# Patient Record
Sex: Female | Born: 1995 | Race: Black or African American | Hispanic: No | Marital: Single | Smoking: Never smoker
Health system: Southern US, Community
[De-identification: ages and names within clinical notes are randomized; demographics above are authoritative.]

## PROBLEM LIST (undated history)

## (undated) DIAGNOSIS — I456 Pre-excitation syndrome: Secondary | ICD-10-CM

## (undated) DIAGNOSIS — L309 Dermatitis, unspecified: Secondary | ICD-10-CM

## (undated) HISTORY — PX: TONSILLECTOMY AND ADENOIDECTOMY: SHX28

---
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
## 2016-07-20 ENCOUNTER — Emergency Department (HOSPITAL_COMMUNITY): Payer: Commercial Managed Care - HMO

## 2016-07-20 ENCOUNTER — Emergency Department (HOSPITAL_COMMUNITY)
Admission: EM | Admit: 2016-07-20 | Discharge: 2016-07-21 | Disposition: A | Discharge status: Home / Self Care | Payer: Commercial Managed Care - HMO | Attending: Emergency Medicine | Admitting: Emergency Medicine

## 2016-07-20 ENCOUNTER — Encounter (HOSPITAL_COMMUNITY): Payer: Self-pay

## 2016-07-20 DIAGNOSIS — J45909 Unspecified asthma, uncomplicated: Secondary | ICD-10-CM | POA: Insufficient documentation

## 2016-07-20 DIAGNOSIS — R002 Palpitations: Secondary | ICD-10-CM | POA: Insufficient documentation

## 2016-07-20 DIAGNOSIS — Z79899 Other long term (current) drug therapy: Secondary | ICD-10-CM | POA: Insufficient documentation

## 2016-07-20 DIAGNOSIS — Z91010 Allergy to peanuts: Secondary | ICD-10-CM | POA: Insufficient documentation

## 2016-07-20 LAB — BASIC METABOLIC PANEL
ANION GAP: 10 (ref 5–15)
BUN: 12 mg/dL (ref 6–20)
CALCIUM: 9.7 mg/dL (ref 8.9–10.3)
CO2: 23 mmol/L (ref 22–32)
Chloride: 105 mmol/L (ref 101–111)
Creatinine, Ser: 0.7 mg/dL (ref 0.44–1.00)
GLUCOSE: 85 mg/dL (ref 65–99)
POTASSIUM: 4.3 mmol/L (ref 3.5–5.1)
Sodium: 138 mmol/L (ref 135–145)

## 2016-07-20 LAB — CBC
HEMATOCRIT: 38.1 % (ref 36.0–46.0)
HEMOGLOBIN: 12.9 g/dL (ref 12.0–15.0)
MCH: 28.8 pg (ref 26.0–34.0)
MCHC: 33.9 g/dL (ref 30.0–36.0)
MCV: 85 fL (ref 78.0–100.0)
Platelets: 538 10*3/uL — ABNORMAL HIGH (ref 150–400)
RBC: 4.48 MIL/uL (ref 3.87–5.11)
RDW: 13.9 % (ref 11.5–15.5)
WBC: 7.2 10*3/uL (ref 4.0–10.5)

## 2016-07-20 LAB — I-STAT TROPONIN, ED: TROPONIN I, POC: 0.11 ng/mL — AB (ref 0.00–0.08)

## 2016-07-20 NOTE — ED Provider Notes (Signed)
[facility]-EMERGENCY DEPT Provider Note   CSN: [id number] Arrival date & time: 07/20/16  2138  By signing my name below, I, Majel Homer, attest that this documentation has been prepared under the direction and in the presence of Shon Baton, MD . Electronically Signed: Majel Homer, Scribe. 07/20/2016. 1:30 AM.  History   Chief Complaint Chief Complaint  Patient presents with  . Palpations  . Chest Pain   The history is provided by the patient. No language interpreter was used.   HPI Comments: Nicole Becker is a 21 y.o. female with PMHx of asthma, who presents to the Emergency Department complaining of sudden onset, sensation of palpitations that began  at ~2:00 PM this afternoon. Pt reports she was lying down at the onset of her symptoms and states it lasted for ~6 hours. She notes hx of palpitations that have spontaneously resolved on their own; however, she states her symptoms lasted for "longer than usual" which is why she visited the ED. Reports workup several years ago at a hospital in [place]. Pt reports associated chest pain secondary to her palpitations but notes her chest pain and palptiations have now resolved in the ED. She states her chest pain was exacerbated with exertion. She notes she has not taken any medication to relieve her symptoms. Pt denies fever, nausea, vomiting, shortness of breath, leg swelling, sick contacts, recent increase in caffeine intake, and hx of PE or echocardiogram.   Past Medical History:  Diagnosis Date  . Asthma   . Eczema    There are no active problems to display for this patient.  Past Surgical History:  Procedure Laterality Date  . TONSILLECTOMY AND ADENOIDECTOMY      OB History    No data available     Home Medications    Prior to Admission medications   Medication Sig Start Date End Date Taking? Authorizing Provider  albuterol (PROVENTIL HFA;VENTOLIN HFA) 108 (90 BASE) MCG/ACT inhaler Inhale 2 puffs into the lungs every 6  (six) hours as needed. For breathing   Yes Historical Provider, MD  cetirizine (ZYRTEC) 10 MG tablet Take 10 mg by mouth daily.   Yes Historical Provider, MD  Norgestimate-Ethinyl Estradiol Triphasic (TRI-LINYAH) 0.18/0.215/0.25 MG-35 MCG tablet Take 1 tablet by mouth daily.   Yes Historical Provider, MD  sodium fluoride (SF 5000 PLUS) 1.1 % CREA dental cream Place 1 application onto teeth every morning.    Yes Historical Provider, MD  tacrolimus (PROTOPIC) 0.1 % ointment Apply 1 application topically 2 (two) times daily as needed (rash).    Yes Historical Provider, MD    Family History No family history on file.  Social History Social History  Substance Use Topics  . Smoking status: Never Smoker  . Smokeless tobacco: Never Used  . Alcohol use No     Allergies   Montelukast sodium and Peanut-containing drug products   Review of Systems Review of Systems  Constitutional: Negative for fever.  Respiratory: Negative for shortness of breath.   Cardiovascular: Positive for chest pain (resolved) and palpitations (resolved). Negative for leg swelling.  Gastrointestinal: Negative for abdominal pain, diarrhea and vomiting.  All other systems reviewed and are negative.  Physical Exam Updated Vital Signs BP 105/75   Pulse 115   Temp 98 F (36.7 C)   Resp 18   Ht 5\' 4"  (1.626 m)   Wt 184 lb (83.5 kg)   LMP 07/07/2016   SpO2 100%   BMI 31.58 kg/m   Physical Exam  Constitutional: She  is oriented to person, place, and time. She appears well-developed and well-nourished. No distress.  HENT:  Head: Normocephalic and atraumatic.  Cardiovascular: Normal rate, regular rhythm and normal heart sounds.   No murmur heard. Pulmonary/Chest: Effort normal and breath sounds normal. No respiratory distress. She has no wheezes.  Abdominal: Soft. Bowel sounds are normal. There is no tenderness. There is no guarding.  Neurological: She is alert and oriented to person, place, and time.  Skin:  Skin is warm and dry.  Psychiatric: She has a normal mood and affect.  Nursing note and vitals reviewed.    ED Treatments / Results  Labs (all labs ordered are listed, but only abnormal results are displayed) Labs Reviewed  CBC - Abnormal; Notable for the following:       Result Value   Platelets 538 (*)    All other components within normal limits  D-DIMER, QUANTITATIVE (NOT AT [facility]) - Abnormal; Notable for the following:    D-Dimer, Quant 0.52 (*)    All other components within normal limits  TROPONIN I - Abnormal; Notable for the following:    Troponin I 0.19 (*)    All other components within normal limits  I-STAT TROPOININ, ED - Abnormal; Notable for the following:    Troponin i, poc 0.11 (*)    All other components within normal limits  BASIC METABOLIC PANEL    EKG  EKG Interpretation  Date/Time:  Sunday July 20 2016 21:46:25 EST Ventricular Rate:  109 PR Interval:  162 QRS Duration: 138 QT Interval:  342 QTC Calculation: 460 R Axis:   5 Text Interpretation:  Sinus tachycardia Non-specific intra-ventricular conduction block Nonspecific T wave abnormality Abnormal ECG No prior for comparison Confirmed by Wilkie Aye  MD, Thatcher Doberstein ([id number]) on 07/20/2016 11:07:26 PM       Radiology Dg Chest 2 View  Result Date: 07/20/2016 CLINICAL DATA:  Elevated heart rate.  Chest pain. EXAM: CHEST  2 VIEW COMPARISON:  None. FINDINGS: The heart size and mediastinal contours are within normal limits. Both lungs are clear. The visualized skeletal structures are unremarkable. IMPRESSION: No active cardiopulmonary disease. Electronically Signed   By: Gerome Sam III M.D   On: 07/20/2016 22:22   Ct Angio Chest Pe W And/or Wo Contrast  Result Date: 07/21/2016 CLINICAL DATA:  Chest pain under the left breast and left ribs radiating to the back since 3 a.m. Positive D-dimer. EXAM: CT ANGIOGRAPHY CHEST WITH CONTRAST TECHNIQUE: Multidetector CT imaging of the chest was performed using the  standard protocol during bolus administration of intravenous contrast. Multiplanar CT image reconstructions and MIPs were obtained to evaluate the vascular anatomy. CONTRAST:  100 mL Isovue 370 COMPARISON:  None. FINDINGS: Cardiovascular: Satisfactory opacification of the pulmonary arteries to the segmental level. No evidence of pulmonary embolism. Normal heart size. No pericardial effusion. Normal caliber thoracic aorta. No aortic dissection. Mediastinum/Nodes: No enlarged mediastinal, hilar, or axillary lymph nodes. Thyroid gland, trachea, and esophagus demonstrate no significant findings. Lungs/Pleura: Evaluation is limited due to motion artifact. No focal airspace disease or consolidation. No pleural effusions. No pneumothorax. Airways are patent. Upper Abdomen: No acute abnormality. Musculoskeletal: Prominent lymph nodes in the axillary regions bilaterally. Largest right axillary node measures 11 mm short axis dimension. These are nonspecific but likely to be reactive. No destructive bone lesions. Review of the MIP images confirms the above findings. IMPRESSION: No evidence of significant pulmonary embolus. No evidence of active pulmonary disease. Prominent axillary lymph nodes are likely reactive. Electronically Signed  By: Burman NievesWilliam  Stevens M.D.   On: 07/21/2016 05:10    Procedures Procedures (including critical care time)  Medications Ordered in ED Medications  iopamidol (ISOVUE-370) 76 % injection (not administered)  aspirin chewable tablet 324 mg (324 mg Oral Given 07/21/16 0326)    DIAGNOSTIC STUDIES:  Oxygen Saturation is 97% on RA, normal by my interpretation.    COORDINATION OF CARE:  1:13 AM Discussed treatment plan with pt at bedside and pt agreed to plan.  Initial Impression / Assessment and Plan / ED Course  I have reviewed the triage vital signs and the nursing notes.  Pertinent labs & imaging results that were available during my care of the patient were reviewed by me and  considered in my medical decision making (see chart for details).  Clinical Course as of Jul 21 548  Mon Jul 21, 2016  0300 Discussed with Dr. Tiburcio PeaHarris, fellow for cardiology. EKG abnormalities likely reflect accessory pathway. Patient will need close follow-up with EP. D-dimer is pending. Repeat troponin pending.  If increasing, patient may need admission.  [CH]  [id number] Discussed with Dr. Gala RomneyBensimhon.  He is not concerned with the elevation in troponin. Likely secondary to sustained tachycardia prior to arrival.  Very low concern for cardiac disease. She needs close EP follow-up. Per Dr. Gala RomneyBensimhon, she does not need formal cardiology evaluation or inpatient testing. She can follow-up with Dr. Ladona Ridgelaylor within the week.  [CH]    Clinical Course User Index [CH] Shon Batonourtney F Kemo Spruce, MD    Patient presents with palpitations. She's currently asymptomatic. Reports prolonged palpitations this afternoon. History of the same once previously and reports workup at an outside hospital. She is unsure of the results.  She is nontoxic. Vital signs reassuring. She is in sinus tachycardia.  EKG with a nonspecific interventricular conduction delay and abnormal morphology. No prior for comparison. See discussions with cardiology above. Initial discussion with fellow; states EKG represents likely a alternative pathway. Patient is a symptomatically and troponin stays stable, can likely be discharged home. Was concerned patient's troponin went from 0.11-0.19. She has remained asymptomatic. Discussed with Dr. Gala RomneyBensimhon.  He feels confident that this is likely related to sustained tachycardia prior to arrival. He will arrange for follow-up with Dr. Ladona Ridgelaylor. Patient is comfortable plan. Discussed plan with mother over the phone. She is also comfortable with plan. Patient was given strict return precautions.  After history, exam, and medical workup I feel the patient has been appropriately medically screened and is safe for discharge  home. Pertinent diagnoses were discussed with the patient. Patient was given return precautions.  I personally performed the services described in this documentation, which was scribed in my presence. The recorded information has been reviewed and is accurate.   Final Clinical Impressions(s) / ED Diagnoses   Final diagnoses:  Palpitations    New Prescriptions New Prescriptions   No medications on file     Shon Batonourtney F Caylan Schifano, MD 07/21/16 [phone]

## 2016-07-20 NOTE — ED Notes (Addendum)
I Stat Troponin results shown to Dr. Adriana Simasook.

## 2016-07-20 NOTE — ED Triage Notes (Signed)
Pt states that around 2 today she started having palpations and CP. Pt states that she has had palpations in the past that have resolved on their own and today it has not resolved. CP is L sided, heavy, denies SOB, denies n/v, some dizziness.

## 2016-07-21 ENCOUNTER — Emergency Department (HOSPITAL_COMMUNITY): Payer: Commercial Managed Care - HMO

## 2016-07-21 LAB — D-DIMER, QUANTITATIVE: D-Dimer, Quant: 0.52 ug{FEU}/mL — ABNORMAL HIGH (ref 0.00–0.50)

## 2016-07-21 LAB — TROPONIN I: Troponin I: 0.19 ng/mL

## 2016-07-21 MED ORDER — IOPAMIDOL (ISOVUE-370) INJECTION 76%
INTRAVENOUS | Status: AC
  Filled 2016-07-21: qty 100

## 2016-07-21 MED ORDER — ASPIRIN 81 MG PO CHEW
324.0000 mg | CHEWABLE_TABLET | Freq: Once | ORAL | Status: AC
  Administered 2016-07-21: 324 mg via ORAL
  Filled 2016-07-21: qty 4

## 2016-07-21 NOTE — ED Notes (Signed)
ED Provider at bedside. 

## 2016-07-21 NOTE — ED Notes (Signed)
Patient transported to CT 

## 2016-07-21 NOTE — Discharge Instructions (Signed)
You were seen today for palpitations. You likely have an accessory pathway in your heart causing you to go into an abnormal fast heart rate and rhythm. This had resolved upon her arrival. You need to follow-up closely with an electrophysiologist. See above. If you have recurrence of symptoms, you need to be reevaluated immediately.

## 2016-07-21 NOTE — ED Notes (Signed)
Spoke with lab personnel to confirm arrival of lab tubes. D-dimer and troponin currently in process.

## 2016-07-21 NOTE — ED Notes (Signed)
Unsuccessful blood draw,  QNS for D-dimer notified nurse.

## 2016-07-21 NOTE — ED Notes (Signed)
Water provided to patient per request.

## 2016-07-31 ENCOUNTER — Institutional Professional Consult (permissible substitution): Payer: BC Managed Care – PPO | Admitting: Internal Medicine

## 2016-08-12 DIAGNOSIS — R002 Palpitations: Secondary | ICD-10-CM | POA: Insufficient documentation

## 2016-08-13 ENCOUNTER — Encounter: Payer: Self-pay | Admitting: Internal Medicine

## 2016-08-13 ENCOUNTER — Encounter (INDEPENDENT_AMBULATORY_CARE_PROVIDER_SITE_OTHER): Payer: Self-pay

## 2016-08-13 ENCOUNTER — Ambulatory Visit (INDEPENDENT_AMBULATORY_CARE_PROVIDER_SITE_OTHER): Payer: Commercial Managed Care - HMO | Admitting: Internal Medicine

## 2016-08-13 VITALS — BP 118/74 | HR 102 | Ht 64.0 in | Wt 182.6 lb

## 2016-08-13 DIAGNOSIS — I471 Supraventricular tachycardia: Secondary | ICD-10-CM

## 2016-08-13 NOTE — Patient Instructions (Addendum)
Medication Instructions:  Your physician recommends that you continue on your current medications as directed. Please refer to the Current Medication list given to you today.   Labwork: BMET / CBC w/ diff / Serum HCG - 1-2 weeks prior to procedure   Testing/Procedures: Your physician has recommended that you have an ablation. Catheter ablation is a medical procedure used to treat some cardiac arrhythmias (irregular heartbeats). During catheter ablation, a long, thin, flexible tube is put into a blood vessel in your groin (upper thigh), or neck. This tube is called an ablation catheter. It is then guided to your heart through the blood vessel. Radio frequency waves destroy small areas of heart tissue where abnormal heartbeats may cause an arrhythmia to start. Please see the instruction sheet given to you today.  Possible dates: 2/12, 3/1, 3/7, 3/19, 3/26   Please call our office to schedule: [phone]   Follow-Up: To be determined    Any Other Special Instructions Will Be Listed Below (If Applicable).     If you need a refill on your cardiac medications before your next appointment, please call your pharmacy.

## 2016-08-13 NOTE — Progress Notes (Signed)
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 She has been healthy otherwise. She developed palpitations in 2012 and went to the ED in [place] but no diagnosis made. She developed recurrent palpitations a week ago and sought medical attention in the [facility] ER. She was not in SVT or atrial fib by the time she arrived and she did not have syncope but she was noted on her ECG to have clear cut ventricular pre-excitation. She returns today for additional evaluation. No family history of sudden death.  Allergies  Allergen Reactions  . Montelukast Sodium Hives  . 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 albuterol (PROVENTIL HFA;VENTOLIN HFA) 108 (90 BASE) MCG/ACT inhaler Inhale 2 puffs into the lungs every 6 (six) hours as needed. For breathing    . cetirizine (ZYRTEC) 10 MG tablet Take 10 mg by mouth daily.    Marland Kitchen EPINEPHrine 0.3 mg/0.3 mL IJ SOAJ injection Inject 1 mL into the muscle as directed.    . mometasone (NASONEX) 50 MCG/ACT nasal spray Place 1 spray into the nose as directed.    . Norgestimate-Ethinyl Estradiol Triphasic (TRI-LINYAH) 0.18/0.215/0.25 MG-35 MCG tablet Take 1 tablet by mouth daily.    . sodium fluoride (SF 5000 PLUS) 1.1 % CREA dental cream Place 1 application onto teeth every morning.     . tacrolimus (PROTOPIC) 0.1 % ointment Apply 1 application topically 2 (two) times daily as needed (rash).      No current facility-administered medications for this visit.      Past Medical History:  Diagnosis Date  . Asthma   . Eczema     ROS:   All systems reviewed and negative except as noted in the HPI.   Past Surgical History:  Procedure Laterality Date  . TONSILLECTOMY AND ADENOIDECTOMY       No family history on file.   Social History   Social History  . Marital  status: Single    Spouse name: N/A  . Number of children: N/A  . Years of education: N/A   Occupational History  . Not on file.   Social History Main Topics  . Smoking status: Never Smoker  . Smokeless tobacco: Never Used  . Alcohol use No  . Drug use: No  . Sexual activity: Not on file   Other Topics Concern  . Not on file   Social History Narrative  . No narrative on file     BP 118/74   Pulse (!) 128   Ht 5\' 4"  (1.626 m)   Wt 182 lb 9.6 oz (82.8 kg)   SpO2 98%   BMI 31.34 kg/m   Physical Exam:  Well appearing young woman, NAD HEENT: Unremarkable Neck:  No JVD, no thyromegally Lymphatics:  No adenopathy Back:  No CVA tenderness Lungs:  Clear with no wheezes. HEART:  Regular rate rhythm, no murmurs, no rubs, no clicks Abd:  soft, positive bowel sounds, no organomegally, no rebound, no guarding Ext:  2 plus pulses, no edema, no cyanosis, no clubbing Skin:  No rashes no nodules Neuro:  CN II through XII intact, motor grossly intact  EKG - sinus rhythm with ventricular pre-excitation, likely a right sided free wall pathway.  DEVICE  Normal device function.  See PaceArt for details.   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 She described her palpitations as being irregular and fast. We discussed the treatment options in detail. Because she has pre-excitation and symptoms, I have recommended catheter ablation. She will call us if she would like to schedule an ablation.  2. Asthma - she is not wheezing on exam today. Beta blockers would not likely be a good choice to treat her WPW with.  Leonia ReevesGregg Clayborn Milnes,M.D.

## 2016-09-09 ENCOUNTER — Encounter (HOSPITAL_COMMUNITY): Payer: Self-pay | Admitting: Emergency Medicine

## 2016-09-09 ENCOUNTER — Emergency Department (HOSPITAL_COMMUNITY)
Admission: EM | Admit: 2016-09-09 | Discharge: 2016-09-09 | Disposition: A | Discharge status: Home / Self Care | Payer: Commercial Managed Care - HMO | Attending: Emergency Medicine | Admitting: Emergency Medicine

## 2016-09-09 DIAGNOSIS — Z91010 Allergy to peanuts: Secondary | ICD-10-CM | POA: Insufficient documentation

## 2016-09-09 DIAGNOSIS — I471 Supraventricular tachycardia: Secondary | ICD-10-CM | POA: Diagnosis not present

## 2016-09-09 DIAGNOSIS — R000 Tachycardia, unspecified: Secondary | ICD-10-CM | POA: Diagnosis present

## 2016-09-09 DIAGNOSIS — I456 Pre-excitation syndrome: Secondary | ICD-10-CM | POA: Insufficient documentation

## 2016-09-09 DIAGNOSIS — J45909 Unspecified asthma, uncomplicated: Secondary | ICD-10-CM | POA: Insufficient documentation

## 2016-09-09 HISTORY — DX: Pre-excitation syndrome: I45.6

## 2016-09-09 LAB — CBC WITH DIFFERENTIAL/PLATELET
BASOS ABS: 0 10*3/uL (ref 0.0–0.1)
Basophils Relative: 1 %
Eosinophils Absolute: 1.2 10*3/uL — ABNORMAL HIGH (ref 0.0–0.7)
Eosinophils Relative: 16 %
HEMATOCRIT: 38.4 % (ref 36.0–46.0)
HEMOGLOBIN: 12.4 g/dL (ref 12.0–15.0)
LYMPHS PCT: 40 %
Lymphs Abs: 2.9 10*3/uL (ref 0.7–4.0)
MCH: 28.4 pg (ref 26.0–34.0)
MCHC: 32.3 g/dL (ref 30.0–36.0)
MCV: 87.9 fL (ref 78.0–100.0)
MONO ABS: 0.6 10*3/uL (ref 0.1–1.0)
Monocytes Relative: 8 %
NEUTROS ABS: 2.6 10*3/uL (ref 1.7–7.7)
NEUTROS PCT: 35 %
Platelets: 431 10*3/uL — ABNORMAL HIGH (ref 150–400)
RBC: 4.37 MIL/uL (ref 3.87–5.11)
RDW: 13.8 % (ref 11.5–15.5)
WBC: 7.3 10*3/uL (ref 4.0–10.5)

## 2016-09-09 LAB — BASIC METABOLIC PANEL
ANION GAP: 9 (ref 5–15)
BUN: 10 mg/dL (ref 6–20)
CALCIUM: 9.3 mg/dL (ref 8.9–10.3)
CO2: 25 mmol/L (ref 22–32)
Chloride: 107 mmol/L (ref 101–111)
Creatinine, Ser: 0.66 mg/dL (ref 0.44–1.00)
GFR calc non Af Amer: >60 mL/min (ref >60)
Glucose, Bld: 101 mg/dL — ABNORMAL HIGH (ref 65–99)
POTASSIUM: 4.3 mmol/L (ref 3.5–5.1)
Sodium: 141 mmol/L (ref 135–145)

## 2016-09-09 LAB — I-STAT TROPONIN, ED: Troponin i, poc: 0 ng/mL (ref 0.00–0.08)

## 2016-09-09 NOTE — ED Triage Notes (Signed)
Pt sitting in lounge and had rapid heartbeat. Saw patient cardiologist who states she did not get SOB. States she felt dizzy but denies nausea. She was in HR of 236, EMS attempted IV and she went down to 130s; now in 110s. Patient is in no distress and denies any pain.

## 2016-09-09 NOTE — ED Provider Notes (Signed)
[facility] DEPT Provider Note   CSN: [id number] Arrival date & time: 09/09/16  1443     History   Chief Complaint Chief Complaint  Patient presents with  . Tachycardia    HPI Nicole Becker is a 21 y.o. female with past medical history Significant for Wolff-Parkinson-White syndrome, last symptomatic episode occurring one month ago, asthma, presenting with an approximate 20 minute episode of palpitations, states EMS measured a rate to 236 prior to resolution of symptoms.  She states she felt slightly lightheaded during the episode, denied nausea, emesis, chest pain.  She did have upper midsternal pressure sensation during the event, now resolved.  She currently denies any symptoms.  She's been taking no OTC medications such as Sudafed, denies caffeine use.  She is followed by Dr Ladona Ridgel who saw her last month and recommended cardiac ablation.  She was trying to get through her spring semester before having this procedure done, but now that she has had 2 episodes so close together, the last one occurring January 7, she plans to contact Dr. Ladona Ridgel to arrange this.    The history is provided by the patient.    Past Medical History:  Diagnosis Date  . Asthma   . Eczema   . Wolff-Parkinson-White (WPW) pattern     Patient Active Problem List   Diagnosis Date Noted  . Palpitations 08/12/2016    Past Surgical History:  Procedure Laterality Date  . TONSILLECTOMY AND ADENOIDECTOMY      OB History    No data available       Home Medications    Prior to Admission medications   Medication Sig Start Date End Date Taking? Authorizing Provider  albuterol (PROVENTIL HFA;VENTOLIN HFA) 108 (90 BASE) MCG/ACT inhaler Inhale 2 puffs into the lungs every 6 (six) hours as needed. For breathing    Historical Provider, MD  cetirizine (ZYRTEC) 10 MG tablet Take 10 mg by mouth daily.    Historical Provider, MD  EPINEPHrine 0.3 mg/0.3 mL IJ SOAJ injection Inject 1 mL into the muscle as  directed.    Historical Provider, MD  mometasone (NASONEX) 50 MCG/ACT nasal spray Place 1 spray into the nose as directed.    Historical Provider, MD  Norgestimate-Ethinyl Estradiol Triphasic (TRI-LINYAH) 0.18/0.215/0.25 MG-35 MCG tablet Take 1 tablet by mouth daily.    Historical Provider, MD  sodium fluoride (SF 5000 PLUS) 1.1 % CREA dental cream Place 1 application onto teeth every morning.     Historical Provider, MD  tacrolimus (PROTOPIC) 0.1 % ointment Apply 1 application topically 2 (two) times daily as needed (rash).     Historical Provider, MD    Family History History reviewed. No pertinent family history.  Social History Social History  Substance Use Topics  . Smoking status: Never Smoker  . Smokeless tobacco: Never Used  . Alcohol use No     Allergies   Montelukast sodium and Peanut-containing drug products   Review of Systems Review of Systems  Constitutional: Negative for fever.  HENT: Negative for congestion and sore throat.   Eyes: Negative.   Respiratory: Positive for chest tightness. Negative for shortness of breath.   Cardiovascular: Positive for palpitations. Negative for chest pain and leg swelling.  Gastrointestinal: Negative for abdominal pain and nausea.  Genitourinary: Negative.   Musculoskeletal: Negative for arthralgias, joint swelling and neck pain.  Skin: Negative.  Negative for rash and wound.  Neurological: Positive for light-headedness. Negative for weakness, numbness and headaches.  Psychiatric/Behavioral: Negative.  Physical Exam Updated Vital Signs BP 124/98   Pulse 94   Temp 98.7 F (37.1 C) (Oral)   Resp 17   Ht 5\' 4"  (1.626 m)   Wt 84.4 kg   LMP 08/10/2016   SpO2 100%   BMI 31.93 kg/m   Physical Exam  Constitutional: She appears well-developed and well-nourished.  HENT:  Head: Normocephalic and atraumatic.  Eyes: Conjunctivae are normal.  Neck: Normal range of motion.  Cardiovascular: Regular rhythm, normal heart  sounds and intact distal pulses.   Borderline tachycardic on exam with rate of 102.  Pulmonary/Chest: Effort normal and breath sounds normal. She has no wheezes.  Abdominal: Soft. Bowel sounds are normal. There is no tenderness.  Musculoskeletal: Normal range of motion.  Neurological: She is alert.  Skin: Skin is warm and dry.  Psychiatric: She has a normal mood and affect.  Nursing note and vitals reviewed.    ED Treatments / Results  Labs (all labs ordered are listed, but only abnormal results are displayed) Labs Reviewed  CBC WITH DIFFERENTIAL/PLATELET - Abnormal; Notable for the following:       Result Value   Platelets 431 (*)    Eosinophils Absolute 1.2 (*)    All other components within normal limits  BASIC METABOLIC PANEL - Abnormal; Notable for the following:    Glucose, Bld 101 (*)    All other components within normal limits  I-STAT TROPOININ, ED    EKG  EKG Interpretation  Date/Time:  Tuesday September 09 2016 14:50:50 EST Ventricular Rate:  105 PR Interval:    QRS Duration: 145 QT Interval:  378 QTC Calculation: 500 R Axis:   2 Text Interpretation:  Sinus or ectopic atrial tachycardia IVCD, consider atypical LBBB No significant change was found Confirmed by CAMPOS  MD, KEVIN ([id number]) on 09/09/2016 4:59:13 PM       Radiology No results found.  Procedures Procedures (including critical care time)  Medications Ordered in ED Medications - No data to display   Initial Impression / Assessment and Plan / ED Course  I have reviewed the triage vital signs and the nursing notes.  Pertinent labs & imaging results that were available during my care of the patient were reviewed by me and considered in my medical decision making (see chart for details).     Rhythm strips reviewed from ems - svt, resolved prior to ed arrival.  No return of sx during ed stay.  Plan f/u with Dr. Ladona Ridgelaylor. Pt to call to arrange appt to discuss proceeding with ablation.  Final  Clinical Impressions(s) / ED Diagnoses   Final diagnoses:  SVT (supraventricular tachycardia) (HCC)  Wolff-Parkinson-White (WPW) pattern    New Prescriptions New Prescriptions   No medications on file     Burgess AmorJulie Chrishonda Hesch, Cordelia Poche-C 09/09/16 1903    Azalia BilisKevin Campos, MD 09/10/16 [phone]

## 2016-09-09 NOTE — ED Notes (Signed)
Pt stable, ambulatory, states understanding of discharge instructions, family at bedside. 

## 2016-09-09 NOTE — ED Notes (Signed)
Pulse 102 when walking O2 sats 98% when walking

## 2016-09-09 NOTE — ED Notes (Signed)
IV team at bedside 

## 2016-09-09 NOTE — ED Notes (Addendum)
Attempted IV x 2 by this RN and once by another.

## 2016-09-25 ENCOUNTER — Emergency Department (HOSPITAL_COMMUNITY)
Admission: EM | Admit: 2016-09-25 | Discharge: 2016-09-25 | Disposition: A | Discharge status: Home / Self Care | Payer: Commercial Managed Care - HMO | Attending: Physician Assistant | Admitting: Physician Assistant

## 2016-09-25 ENCOUNTER — Telehealth: Payer: Self-pay | Admitting: Internal Medicine

## 2016-09-25 ENCOUNTER — Emergency Department (HOSPITAL_COMMUNITY): Payer: Commercial Managed Care - HMO

## 2016-09-25 ENCOUNTER — Encounter (HOSPITAL_COMMUNITY): Payer: Self-pay

## 2016-09-25 DIAGNOSIS — R0789 Other chest pain: Secondary | ICD-10-CM

## 2016-09-25 DIAGNOSIS — J45909 Unspecified asthma, uncomplicated: Secondary | ICD-10-CM | POA: Insufficient documentation

## 2016-09-25 DIAGNOSIS — Z79899 Other long term (current) drug therapy: Secondary | ICD-10-CM | POA: Diagnosis not present

## 2016-09-25 DIAGNOSIS — Z91010 Allergy to peanuts: Secondary | ICD-10-CM | POA: Insufficient documentation

## 2016-09-25 LAB — CBC
HEMATOCRIT: 37.3 % (ref 36.0–46.0)
HEMOGLOBIN: 12 g/dL (ref 12.0–15.0)
MCH: 28.2 pg (ref 26.0–34.0)
MCHC: 32.2 g/dL (ref 30.0–36.0)
MCV: 87.6 fL (ref 78.0–100.0)
Platelets: 423 10*3/uL — ABNORMAL HIGH (ref 150–400)
RBC: 4.26 MIL/uL (ref 3.87–5.11)
RDW: 13.7 % (ref 11.5–15.5)
WBC: 6.1 10*3/uL (ref 4.0–10.5)

## 2016-09-25 LAB — BASIC METABOLIC PANEL
ANION GAP: 8 (ref 5–15)
BUN: 11 mg/dL (ref 6–20)
CALCIUM: 8.9 mg/dL (ref 8.9–10.3)
CHLORIDE: 106 mmol/L (ref 101–111)
CO2: 23 mmol/L (ref 22–32)
Creatinine, Ser: 0.63 mg/dL (ref 0.44–1.00)
GFR calc Af Amer: >60 mL/min (ref >60)
GFR calc non Af Amer: >60 mL/min (ref >60)
GLUCOSE: 86 mg/dL (ref 65–99)
POTASSIUM: 4.1 mmol/L (ref 3.5–5.1)
Sodium: 137 mmol/L (ref 135–145)

## 2016-09-25 LAB — I-STAT TROPONIN, ED: TROPONIN I, POC: 0 ng/mL (ref 0.00–0.08)

## 2016-09-25 MED ORDER — IBUPROFEN 800 MG PO TABS
800.0000 mg | ORAL_TABLET | Freq: Once | ORAL | Status: AC
  Administered 2016-09-25: 800 mg via ORAL
  Filled 2016-09-25: qty 1

## 2016-09-25 MED ORDER — ACETAMINOPHEN 325 MG PO TABS
650.0000 mg | ORAL_TABLET | Freq: Once | ORAL | Status: AC
  Administered 2016-09-25: 650 mg via ORAL
  Filled 2016-09-25: qty 2

## 2016-09-25 MED ORDER — SODIUM CHLORIDE 0.9 % IV BOLUS (SEPSIS)
1000.0000 mL | Freq: Once | INTRAVENOUS | Status: AC
  Administered 2016-09-25: 1000 mL via INTRAVENOUS

## 2016-09-25 NOTE — Telephone Encounter (Signed)
New Message:    Pt is having chest pains and pressure since last night,needs to be seen today please.

## 2016-09-25 NOTE — Discharge Instructions (Signed)
We are unsure what is causing her pain.Please take ibuprofen or Tylenol to help with any symptoms. Please return with any shortness of breath worsening chest pain or syncope or high heart rate.

## 2016-09-25 NOTE — ED Notes (Signed)
Pt ambulated well with a steady gait and reports feeling better.

## 2016-09-25 NOTE — ED Notes (Signed)
Patient transported to X-ray 

## 2016-09-25 NOTE — ED Notes (Signed)
Taken to xray at this time. 

## 2016-09-25 NOTE — Telephone Encounter (Signed)
Spoke with Sharion DoveYolanda Johnson, NP. Patient is at their office complaining of chest pain since last night. She states that the patient is having active chest pain at rest with SOB, lightheadedness, and dizziness that is worsening. She states that they are placing oxygen on her now. The patient was advised to be seen in the ER.

## 2016-09-25 NOTE — ED Notes (Signed)
Pt returned from xray and ambulated to the bathroom.

## 2016-09-25 NOTE — ED Provider Notes (Signed)
[facility] Provider Note   CSN: [id number] Arrival date & time: 09/25/16  1104     History   Chief Complaint Chief Complaint  Patient presents with  . Chest Pain    HPI Nicole Becker is a 21 y.o. female.  HPI   She is a 21 year old female with history of asthma, eczema and will Parkinson's White. She is presenting today with chest pain since yesterday. Patient has occasional stabbing her chest. Worse with taking deep breaths. This started yesterday. Patient has no shortness breath. No cough.  Past Medical History:  Diagnosis Date  . Asthma   . Eczema   . Wolff-Parkinson-White (WPW) pattern     Patient Active Problem List   Diagnosis Date Noted  . Palpitations 08/12/2016    Past Surgical History:  Procedure Laterality Date  . TONSILLECTOMY AND ADENOIDECTOMY      OB History    No data available       Home Medications    Prior to Admission medications   Medication Sig Start Date End Date Taking? Authorizing Provider  albuterol (PROVENTIL HFA;VENTOLIN HFA) 108 (90 BASE) MCG/ACT inhaler Inhale 2 puffs into the lungs every 6 (six) hours as needed. For breathing    Historical Provider, MD  cetirizine (ZYRTEC) 10 MG tablet Take 10 mg by mouth daily.    Historical Provider, MD  EPINEPHrine 0.3 mg/0.3 mL IJ SOAJ injection Inject 1 mL into the muscle as directed.    Historical Provider, MD  mometasone (NASONEX) 50 MCG/ACT nasal spray Place 1 spray into the nose as directed.    Historical Provider, MD  Norgestimate-Ethinyl Estradiol Triphasic (TRI-LINYAH) 0.18/0.215/0.25 MG-35 MCG tablet Take 1 tablet by mouth daily.    Historical Provider, MD  sodium fluoride (SF 5000 PLUS) 1.1 % CREA dental cream Place 1 application onto teeth every morning.     Historical Provider, MD  tacrolimus (PROTOPIC) 0.1 % ointment Apply 1 application topically 2 (two) times daily as needed (rash).     Historical Provider, MD    Family History No family history on file.  Social  History Social History  Substance Use Topics  . Smoking status: Never Smoker  . Smokeless tobacco: Never Used  . Alcohol use No     Allergies   Montelukast sodium; Shellfish allergy; and Peanut-containing drug products   Review of Systems Review of Systems  Constitutional: Negative for activity change and fatigue.  HENT: Negative for congestion.   Respiratory: Positive for chest tightness. Negative for cough.   Cardiovascular: Positive for chest pain.  Gastrointestinal: Negative for abdominal distention.  Genitourinary: Negative for dysuria.  Skin: Negative for rash.  Allergic/Immunologic: Negative for immunocompromised state.  All other systems reviewed and are negative.    Physical Exam Updated Vital Signs BP 129/82 (BP Location: Left Arm)   Pulse 107   Temp 97.8 F (36.6 C) (Oral)   Resp 18   Ht 5\' 4"  (1.626 m)   Wt 184 lb (83.5 kg)   LMP 09/06/2016 (Within Days)   SpO2 100%   BMI 31.58 kg/m   Physical Exam  Constitutional: She is oriented to person, place, and time. She appears well-developed and well-nourished.  Diffuse eczema.  HENT:  Head: Normocephalic and atraumatic.  Eyes: Right eye exhibits no discharge.  Cardiovascular: Normal rate, regular rhythm and normal heart sounds.   No murmur heard. Pulmonary/Chest: Effort normal and breath sounds normal. She has no wheezes. She has no rales.  Abdominal: Soft. She exhibits no distension. There is no  tenderness.  Neurological: She is oriented to person, place, and time.  Skin: Skin is warm and dry. She is not diaphoretic.  Psychiatric: She has a normal mood and affect.  Nursing note and vitals reviewed.    ED Treatments / Results  Labs (all labs ordered are listed, but only abnormal results are displayed) Labs Reviewed  CBC - Abnormal; Notable for the following:       Result Value   Platelets 423 (*)    All other components within normal limits  BASIC METABOLIC PANEL  I-STAT TROPOININ, ED     EKG  EKG Interpretation  Date/Time:  Thursday September 25 2016 11:10:43 EDT Ventricular Rate:  86 PR Interval:    QRS Duration: 127 QT Interval:  378 QTC Calculation: 453 R Axis:   21 Text Interpretation:  Sinus rhythm Short PR interval Left bundle branch block No significant change since last tracing Abnormal ekg Reconfirmed by Kandis Mannan ([id number]) on 09/25/2016 12:52:41 PM       Radiology Dg Chest 2 View  Result Date: 09/25/2016 CLINICAL DATA:  Chest pain. EXAM: CHEST  2 VIEW COMPARISON:  CT 07/21/2016. FINDINGS: Mediastinum and hilar structures are normal. Low lung volumes. Mild peribronchial cuffing. Mild bronchitis cannot be excluded . No pleural effusion or pneumothorax. Heart size normal. No acute bony abnormality . IMPRESSION: Low lung volumes. Mild peribronchial cuffing. Mild bronchitis cannot be excluded. Electronically Signed   By: Maisie Fus  Register   On: 09/25/2016 12:00    Procedures Procedures (including critical care time)  Medications Ordered in ED Medications  sodium chloride 0.9 % bolus 1,000 mL (1,000 mLs Intravenous New Bag/Given 09/25/16 1311)  ibuprofen (ADVIL,MOTRIN) tablet 800 mg (800 mg Oral Given 09/25/16 1311)  acetaminophen (TYLENOL) tablet 650 mg (650 mg Oral Given 09/25/16 1310)     Initial Impression / Assessment and Plan / ED Course  I have reviewed the triage vital signs and the nursing notes.  Pertinent labs & imaging results that were available during my care of the patient were reviewed by me and considered in my medical decision making (see chart for details).     This 21 year old female with asthma, eczema, Wolf Parkinson White. Patient has plans to ablation after spring semester. Patient has chest pain. Normal vital signs. Do not believe the patient has any reason for increased risk of ACS.  CXR normal. Normal vitals. She has been carrying a heavy backpack around school and we think it may be due to this.   2:31 PM Discussed with  her mom. Doubt acs, pe or other pathology,. Likely msk. Will have her return with symtpoms or any other concerns.   Final Clinical Impressions(s) / ED Diagnoses   Final diagnoses:  None    New Prescriptions New Prescriptions   No medications on file     Nitza Schmid Randall An, MD 09/26/16 1038

## 2016-09-25 NOTE — ED Notes (Signed)
Pt given food, tolerating well.

## 2016-09-25 NOTE — ED Triage Notes (Signed)
Pt presents to the ed with complaints of chest pain that is in the center of her chest radiating to her left side, states it feels deep and heavy, it started yesterday and got worse today so she went to the clinic at [facility] and they sent her here, she received 324 of aspirin, the pain worsens with deep breaths.

## 2017-01-04 ENCOUNTER — Encounter: Payer: Self-pay | Admitting: Internal Medicine

## 2017-01-04 DIAGNOSIS — I471 Supraventricular tachycardia: Secondary | ICD-10-CM

## 2017-01-30 ENCOUNTER — Other Ambulatory Visit: Payer: 59 | Admitting: *Deleted

## 2017-01-30 DIAGNOSIS — I471 Supraventricular tachycardia: Secondary | ICD-10-CM

## 2017-01-30 LAB — BASIC METABOLIC PANEL
BUN / CREAT RATIO: 14 (ref 9–23)
BUN: 10 mg/dL (ref 6–20)
CO2: 24 mmol/L (ref 20–29)
Calcium: 9.1 mg/dL (ref 8.7–10.2)
Chloride: 104 mmol/L (ref 96–106)
Creatinine, Ser: 0.72 mg/dL (ref 0.57–1.00)
GFR calc non Af Amer: 121 mL/min/{1.73_m2} (ref >59)
GFR, EST AFRICAN AMERICAN: 139 mL/min/{1.73_m2} (ref >59)
GLUCOSE: 143 mg/dL — AB (ref 65–99)
Potassium: 3.6 mmol/L (ref 3.5–5.2)
SODIUM: 138 mmol/L (ref 134–144)

## 2017-01-30 LAB — CBC WITH DIFFERENTIAL/PLATELET
Basophils Absolute: 0 10*3/uL (ref 0.0–0.2)
Basos: 0 %
EOS (ABSOLUTE): 2.5 10*3/uL — AB (ref 0.0–0.4)
Eos: 31 %
Hematocrit: 33.2 % — ABNORMAL LOW (ref 34.0–46.6)
Hemoglobin: 11.4 g/dL (ref 11.1–15.9)
LYMPHS ABS: 1.5 10*3/uL (ref 0.7–3.1)
LYMPHS: 18 %
MCH: 28.7 pg (ref 26.6–33.0)
MCHC: 34.3 g/dL (ref 31.5–35.7)
MCV: 84 fL (ref 79–97)
MONOS ABS: 0.5 10*3/uL (ref 0.1–0.9)
Monocytes: 6 %
NEUTROS ABS: 3.6 10*3/uL (ref 1.4–7.0)
Neutrophils: 45 %
PLATELETS: 481 10*3/uL — AB (ref 150–379)
RBC: 3.97 x10E6/uL (ref 3.77–5.28)
RDW: 13.7 % (ref 12.3–15.4)
WBC: 8.1 10*3/uL (ref 3.4–10.8)

## 2017-01-30 LAB — HCG, SERUM, QUALITATIVE: HCG, BETA SUBUNIT, QUAL, SERUM: NEGATIVE m[IU]/mL (ref <6)

## 2017-02-11 ENCOUNTER — Ambulatory Visit (HOSPITAL_COMMUNITY)
Admission: RE | Admit: 2017-02-11 | Discharge: 2017-02-12 | Disposition: A | Discharge status: Home / Self Care | Payer: 59 | Source: Ambulatory Visit | Attending: Internal Medicine | Admitting: Internal Medicine

## 2017-02-11 ENCOUNTER — Encounter (HOSPITAL_COMMUNITY)
Admission: RE | Disposition: A | Discharge status: Home / Self Care | Payer: Self-pay | Source: Ambulatory Visit | Attending: Internal Medicine

## 2017-02-11 ENCOUNTER — Encounter (HOSPITAL_COMMUNITY): Payer: Self-pay | Admitting: Internal Medicine

## 2017-02-11 DIAGNOSIS — Z79899 Other long term (current) drug therapy: Secondary | ICD-10-CM | POA: Diagnosis not present

## 2017-02-11 DIAGNOSIS — Z91013 Allergy to seafood: Secondary | ICD-10-CM | POA: Diagnosis not present

## 2017-02-11 DIAGNOSIS — I456 Pre-excitation syndrome: Secondary | ICD-10-CM | POA: Diagnosis present

## 2017-02-11 DIAGNOSIS — J45909 Unspecified asthma, uncomplicated: Secondary | ICD-10-CM | POA: Insufficient documentation

## 2017-02-11 DIAGNOSIS — I471 Supraventricular tachycardia, unspecified: Secondary | ICD-10-CM

## 2017-02-11 HISTORY — PX: SVT ABLATION: EP1225

## 2017-02-11 LAB — PREGNANCY, URINE: PREG TEST UR: NEGATIVE

## 2017-02-11 SURGERY — SVT ABLATION
Anesthesia: LOCAL

## 2017-02-11 MED ORDER — ONDANSETRON HCL 4 MG/2ML IJ SOLN: 4.0000 mg | Freq: Four times a day (QID) | INTRAMUSCULAR | Status: DC | PRN

## 2017-02-11 MED ORDER — SODIUM CHLORIDE 0.9% FLUSH: 3.0000 mL | INTRAVENOUS | Status: DC | PRN

## 2017-02-11 MED ORDER — LIDOCAINE HCL (PF) 1 % IJ SOLN
INTRAMUSCULAR | Status: AC
  Filled 2017-02-11: qty 30

## 2017-02-11 MED ORDER — EPINEPHRINE 0.3 MG/0.3ML IJ SOAJ
1.0000 mg | INTRAMUSCULAR | Status: DC | PRN
  Filled 2017-02-11: qty 1.2

## 2017-02-11 MED ORDER — NORGESTIM-ETH ESTRAD TRIPHASIC 0.18/0.215/0.25 MG-35 MCG PO TABS: 1.0000 | ORAL_TABLET | Freq: Every day | ORAL | Status: DC

## 2017-02-11 MED ORDER — FENTANYL CITRATE (PF) 100 MCG/2ML IJ SOLN
INTRAMUSCULAR | Status: AC
  Filled 2017-02-11: qty 2

## 2017-02-11 MED ORDER — SODIUM CHLORIDE 0.9% FLUSH
3.0000 mL | Freq: Two times a day (BID) | INTRAVENOUS | Status: DC
  Administered 2017-02-11: 3 mL via INTRAVENOUS

## 2017-02-11 MED ORDER — MIDAZOLAM HCL 5 MG/5ML IJ SOLN
INTRAMUSCULAR | Status: AC
  Filled 2017-02-11: qty 5

## 2017-02-11 MED ORDER — MIDAZOLAM HCL 5 MG/5ML IJ SOLN
INTRAMUSCULAR | Status: DC | PRN
  Administered 2017-02-11 (×4): 1 mg via INTRAVENOUS
  Administered 2017-02-11: 2 mg via INTRAVENOUS
  Administered 2017-02-11: 1 mg via INTRAVENOUS
  Administered 2017-02-11: 2 mg via INTRAVENOUS
  Administered 2017-02-11 (×11): 1 mg via INTRAVENOUS

## 2017-02-11 MED ORDER — HEPARIN (PORCINE) IN NACL 2-0.9 UNIT/ML-% IJ SOLN
INTRAMUSCULAR | Status: AC
  Filled 2017-02-11: qty 500

## 2017-02-11 MED ORDER — FENTANYL CITRATE (PF) 100 MCG/2ML IJ SOLN
INTRAMUSCULAR | Status: DC | PRN
  Administered 2017-02-11 (×4): 12.5 ug via INTRAVENOUS
  Administered 2017-02-11: 25 ug via INTRAVENOUS
  Administered 2017-02-11: 12.5 ug via INTRAVENOUS
  Administered 2017-02-11: 25 ug via INTRAVENOUS
  Administered 2017-02-11 (×4): 12.5 ug via INTRAVENOUS
  Administered 2017-02-11: 25 ug via INTRAVENOUS
  Administered 2017-02-11 (×5): 12.5 ug via INTRAVENOUS

## 2017-02-11 MED ORDER — SODIUM FLUORIDE 1.1 % DT CREA: 1.0000 "application " | TOPICAL_CREAM | DENTAL | Status: DC

## 2017-02-11 MED ORDER — HEPARIN (PORCINE) IN NACL 2-0.9 UNIT/ML-% IJ SOLN
INTRAMUSCULAR | Status: AC | PRN
  Administered 2017-02-11: 500 mL

## 2017-02-11 MED ORDER — LIDOCAINE HCL (PF) 1 % IJ SOLN
INTRAMUSCULAR | Status: DC | PRN
  Administered 2017-02-11 (×2): 30 mL

## 2017-02-11 MED ORDER — SODIUM CHLORIDE 0.9 % IV SOLN: 250.0000 mL | INTRAVENOUS | Status: DC | PRN

## 2017-02-11 MED ORDER — TACROLIMUS 0.1 % EX OINT
1.0000 "application " | TOPICAL_OINTMENT | Freq: Two times a day (BID) | CUTANEOUS | Status: DC | PRN
  Filled 2017-02-11: qty 1

## 2017-02-11 MED ORDER — ACETAMINOPHEN 325 MG PO TABS: 650.0000 mg | ORAL_TABLET | ORAL | Status: DC | PRN

## 2017-02-11 SURGICAL SUPPLY — 15 items
BAG SNAP BAND KOVER 36X36 (MISCELLANEOUS) ×2 IMPLANT
CATH CELSIUS THERM D CV 7F (ABLATOR) ×2 IMPLANT
CATH DUODECA HALO/ISMUS 7FR (CATHETERS) ×2 IMPLANT
CATH HEX JOSEPH 2-5-2 65CM 6F (CATHETERS) ×2 IMPLANT
CATH JOSEPHSON QUAD-ALLRED 6FR (CATHETERS) ×4 IMPLANT
INTRODUCER SWARTZ SL4 8F (SHEATH) ×2 IMPLANT
PACK EP LATEX FREE (CUSTOM PROCEDURE TRAY) ×1
PACK EP LF (CUSTOM PROCEDURE TRAY) ×1 IMPLANT
PAD DEFIB LIFELINK (PAD) ×2 IMPLANT
SHEATH FASTCATH SR2 63CM 8F (SHEATH) ×2 IMPLANT
SHEATH PINNACLE 6F 10CM (SHEATH) ×4 IMPLANT
SHEATH PINNACLE 7F 10CM (SHEATH) ×2 IMPLANT
SHEATH PINNACLE 8F 10CM (SHEATH) ×4 IMPLANT
SHEATH PINNACLE 9F 10CM (SHEATH) ×2 IMPLANT
SHIELD RADPAD SCOOP 12X17 (MISCELLANEOUS) ×2 IMPLANT

## 2017-02-11 NOTE — Discharge Instructions (Signed)
No driving for 4 days. No lifting over 5 lbs for 1 week. No sexual activity for 1 week. You may return to work on 02/12/17. Keep procedure site clean & dry. If you notice increased pain, swelling, bleeding or pus, call/return!  You may shower, but no soaking baths/hot tubs/pools for 1 week.

## 2017-02-11 NOTE — Progress Notes (Signed)
Site area: Right groin a 6, 8, and 8 french venous sheath was removed  Site Prior to Removal:  Level 0  Pressure Applied For 20 MINUTES    Bedrest Beginning at 1400p  Manual:   Yes.    Patient Status During Pull:  stable  Post Pull Groin Site:  Level 0  Post Pull Instructions Given:  Yes.    Post Pull Pulses Present:  Yes.    Dressing Applied:  Yes.    Comments:  VS remain stable during sheath pull.

## 2017-02-11 NOTE — H&P (Signed)
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 She has been healthy otherwise. She developed palpitations in 2012 and went to the [place] in [place] but no diagnosis made. She developed recurrent palpitations a week ago and sought medical attention [facility]. She was not in SVT or atrial fib by the time she arrived and she did not have syncope but she was noted on her ECG to have clear cut ventricular pre-excitation. She returns today for additional evaluation. No family history of sudden death.       Allergies  Allergen Reactions  . Montelukast Sodium Hives  . Peanut-Containing Drug Products Itching, Rash and Other (See Comments)    Allergic to pecans           Current Outpatient Prescriptions  Medication Sig Dispense Refill  . albuterol (PROVENTIL HFA;VENTOLIN HFA) 108 (90 BASE) MCG/ACT inhaler Inhale 2 puffs into the lungs every 6 (six) hours as needed. For breathing    . cetirizine (ZYRTEC) 10 MG tablet Take 10 mg by mouth daily.    Marland Kitchen. EPINEPHrine 0.3 mg/0.3 mL IJ SOAJ injection Inject 1 mL into the muscle as directed.    . mometasone (NASONEX) 50 MCG/ACT nasal spray Place 1 spray into the nose as directed.    . Norgestimate-Ethinyl Estradiol Triphasic (TRI-LINYAH) 0.18/0.215/0.25 MG-35 MCG tablet Take 1 tablet by mouth daily.    . sodium fluoride (SF 5000 PLUS) 1.1 % CREA dental cream Place 1 application onto teeth every morning.     . tacrolimus (PROTOPIC) 0.1 % ointment Apply 1 application topically 2 (two) times daily as needed (rash).      No current facility-administered medications for this visit.          Past Medical History:  Diagnosis Date  . Asthma   . Eczema     ROS:   All systems reviewed and negative except as noted in the HPI.        Past Surgical History:  Procedure Laterality Date  . TONSILLECTOMY AND ADENOIDECTOMY       No family history on  file.   Social History        Social History  . Marital status: Single    Spouse name: N/A  . Number of children: N/A  . Years of education: N/A   Occupational History  . Not on file.       Social History Main Topics  . Smoking status: Never Smoker  . Smokeless tobacco: Never Used  . Alcohol use No  . Drug use: No  . Sexual activity: Not on file       Other Topics Concern  . Not on file      Social History Narrative  . No narrative on file     BP 118/74   Pulse (!) 128   Ht 5\' 4"  (1.626 m)   Wt 182 lb 9.6 oz (82.8 kg)   SpO2 98%   BMI 31.34 kg/m   Physical Exam:  Well appearing young woman, NAD HEENT: Unremarkable Neck:  No JVD, no thyromegally Lymphatics:  No adenopathy Back:  No CVA tenderness Lungs:  Clear with no wheezes. HEART:  Regular rate rhythm, no murmurs, no rubs, no clicks Abd:  soft, positive bowel sounds, no organomegally, no rebound, no guarding Ext:  2 plus pulses, no edema, no cyanosis, no clubbing Skin:  No rashes no nodules Neuro:  CN II through XII intact, motor grossly  intact  EKG - sinus rhythm with ventricular pre-excitation, likely a right sided free wall pathway.  DEVICE  Normal device function.  See PaceArt for details.   Assess/Plan: 1. WPW syndrome - she is symptomatic and may have had atrial fib. She described her palpitations as being irregular and fast. We discussed the treatment options in detail. Because she has pre-excitation and symptoms, I have recommended catheter ablation. She will call us if she would like to schedule an ablation.  2. Asthma - she is not wheezing on exam today. Beta blockers would not likely be a good choice to treat her WPW with.  Gerhard MunchGregg Camrynn Mcclintic,M.D.  EP Attending  Patient seen and examined. Agree with above. Since I saw her last, no change in the history, exam, assessment and plan. For EP study and catheter ablation of WPW syndrome. I have again reviewed the  risks/benefits/goals/expectations of the procedure with the patient and she wishes to proceed.  Leonia ReevesGregg Nickalos Petersen,M.D.

## 2017-02-11 NOTE — Progress Notes (Signed)
Site area: Right Internal jugular a 7 french venous sheath was removed  Site Prior to Removal:  Level 0  Pressure Applied For 10 MINUTES    Bedrest Beginning at 1400  Manual:   Yes.    Patient Status During Pull:  stable  Post Pull Groin Site:  Level 0  Post Pull Instructions Given:  Yes.    Post Pull Pulses Present:  Yes.    Dressing Applied:  Yes.    Comments:  VS remain stable

## 2017-02-12 DIAGNOSIS — I456 Pre-excitation syndrome: Secondary | ICD-10-CM | POA: Diagnosis not present

## 2017-02-12 MED ORDER — METOPROLOL TARTRATE 25 MG PO TABS: 25.0000 mg | ORAL_TABLET | Freq: Four times a day (QID) | ORAL | 4 refills | Status: AC | PRN

## 2017-02-12 NOTE — Discharge Summary (Signed)
ELECTROPHYSIOLOGY PROCEDURE DISCHARGE SUMMARY    Patient ID: Nicole Becker,  MRN: [id number], DOB/AGE: 09-30-1995 21 y.o.  Admit date: 02/11/2017 Discharge date: 02/12/2017  Primary Care Physician: System, Pcp Not In  Primary Cardiologist/Electrophysiologist: Dr. Ladona Ridgelaylor  Primary Discharge Diagnosis:  1. WPW  Secondary Discharge Diagnosis:  1. Asthma  Allergies  Allergen Reactions  . Shellfish Allergy Hives  . Montelukast Sodium Hives  . Peanut-Containing Drug Products Itching, Rash and Other (See Comments)    Allergic to pecans TREE NUTS     Procedures This Admission: 1.  Electrophysiology study and radiofrequency catheter ablation on 02/11/17 by Dr Ladona Ridgelaylor.   This study demonstrated Conclusion: Unsuccessful EP study and catheter ablation of a manifest right lateral accessory pathway. Pathway conduction was readily terminated with RF energy application but unfortunately always recurred. There were no inducible arrhythmias following ablation and no early apparent complications.   Brief HPI: Nicole Becker is a 21 y.o. female with a past medical history as outlined above. She has had increasing tachypalpitations with pre-excitation on her EKG.  Risks, benefits, and alternatives to ablation were reviewed with the patient who wished to proceed.   Hospital Course:  The patient was admitted and underwent EPS/RFCA with details as outlined above. She was monitored on telemetry overnight which demonstrated SR/ST 90's-110.  Right groin and right IJ incisions were without complication. She was examined by Dr. Ladona Ridgelaylor and considered stable for discharge to home.  Given her pathway was only modified, she is being Rx Lopressor 25mg  PRN for palpitaions.  Follow up has been arranged in 4 weeks.  Wound care and restrictions were reviewed with the patient prior to discharge.   Physical Exam: Vitals:   02/11/17 1844 02/11/17 2053 02/12/17 0239 02/12/17 0627  BP: (!) 100/56 122/77 (!)  121/53 124/69  Pulse:  (!) 104  (!) 104  Resp:  20 18 20   Temp:  98.3 F (36.8 C)  98.5 F (36.9 C)  TempSrc:  Oral  Oral  SpO2:  100%  100%  Weight:    187 lb (84.8 kg)  Height:        GEN- The patient is well appearing, alert and oriented x 3 today.   HEENT: normocephalic, atraumatic; sclera clear, conjunctiva pink; hearing intact; oropharynx clear; neck supple, no JVP, right IJ site is stable, no bleeding or hematoma Lymph- no cervical lymphadenopathy Lungs- CTA b/l, normal work of breathing.  No wheezes, rales, rhonchi Heart- RRR, no murmurs, rubs or gallops, PMI not laterally displaced GI- soft, non-tender, non-distended Extremities- no clubbing, cyanosis, or edema; DP/PT/radial pulses 2+ bilaterally, right groin without hematoma/bruit MS- no significant deformity or atrophy Skin- warm and dry, no rash or lesion Psych- euthymic mood, full affect Neuro- strength and sensation are intact   Discharge Vitals:  BP 124/69 (BP Location: Right Arm)   Pulse (!) 104   Temp 98.5 F (36.9 C) (Oral)   Resp 20   Ht 5\' 4"  (1.626 m)   Wt 187 lb (84.8 kg)   SpO2 100%   BMI 32.10 kg/m     Labs:   Lab Results  Component Value Date   WBC 8.1 01/30/2017   HGB 11.4 01/30/2017   HCT 33.2 (L) 01/30/2017   MCV 84 01/30/2017   PLT 481 (H) 01/30/2017   No results for input(s): NA, K, CL, CO2, BUN, CREATININE, CALCIUM, PROT, BILITOT, ALKPHOS, ALT, AST, GLUCOSE in the last 168 hours.  Invalid input(s): LABALBU  Discharge Medications:  Allergies as of  02/12/2017      Reactions   Shellfish Allergy Hives   Montelukast Sodium Hives   Peanut-containing Drug Products Itching, Rash, Other (See Comments)   Allergic to pecans TREE NUTS      Medication List    TAKE these medications   albuterol 108 (90 Base) MCG/ACT inhaler Commonly known as:  PROVENTIL HFA;VENTOLIN HFA Inhale 2 puffs into the lungs every 6 (six) hours as needed. For breathing   cetirizine 10 MG tablet Commonly known  as:  ZYRTEC Take 10 mg by mouth daily.   EPINEPHrine 0.3 mg/0.3 mL Soaj injection Commonly known as:  EPI-PEN Inject 1 mL into the muscle as directed.   metoprolol tartrate 25 MG tablet Commonly known as:  LOPRESSOR Take 1 tablet (25 mg total) by mouth 4 (four) times daily as needed. May take up to 4 times per day for palpitations as discussed.   mometasone 50 MCG/ACT nasal spray Commonly known as:  NASONEX Place 1 spray into the nose daily.   SF 5000 PLUS 1.1 % Crea dental cream Generic drug:  sodium fluoride Place 1 application onto teeth every morning.   tacrolimus 0.1 % ointment Commonly known as:  PROTOPIC Apply 1 application topically 2 (two) times daily as needed (rash).   TRI-LINYAH 0.18/0.215/0.25 MG-35 MCG tablet Generic drug:  Norgestimate-Ethinyl Estradiol Triphasic Take 1 tablet by mouth daily.       Disposition:  Home Discharge Instructions    Diet - low sodium heart healthy    Complete by:  As directed    Increase activity slowly    Complete by:  As directed      Follow-up Information    Marinus Mawaylor, Carlen Fils W, MD Follow up on 03/10/2017.   Specialty:  Cardiology Why:  4:15PM  Contact information: [address] [place] [id number] [phone]           Duration of Discharge Encounter: Greater than 30 minutes including physician time.  Norma FredricksonSigned, Renee Ursuy, PA-C 02/12/2017 8:49 AM  EP Attending  Patient seen and examined. Agree with above. She is stable after a difficult ablation. Her AP has been modified. She loses pathway conduction at 120/min and her SVT last night was 140 instead of 220. We discussed implications of these results. She will be given as needed beta blocker. No indication for AA drug therapy at this time. I will see her back in 4 weeks.   Leonia ReevesGregg Allante Whitmire,M.D.

## 2017-03-10 ENCOUNTER — Encounter: Payer: Self-pay | Admitting: Internal Medicine

## 2017-03-10 ENCOUNTER — Ambulatory Visit (INDEPENDENT_AMBULATORY_CARE_PROVIDER_SITE_OTHER): Payer: 59 | Admitting: Internal Medicine

## 2017-03-10 VITALS — BP 112/70 | HR 112 | Ht 63.0 in | Wt 194.0 lb

## 2017-03-10 DIAGNOSIS — I471 Supraventricular tachycardia: Secondary | ICD-10-CM | POA: Diagnosis not present

## 2017-03-10 NOTE — Progress Notes (Signed)
HPI Ms. Hagans-Greene returns today for followup of her WPW. She is a pleasant 21 year old woman with palpitations and documented SVT at over 220 bpm. She underwent EP study and catheter ablation several weeks ago. She was found to have a right lateral accessory pathway. The pathway was modified, and during ablation would go away, but then returned typically several minutes later. Her fastest ventricular response after ablation was 160 bpm over her pathway. In the interim she has been stable. I gave her a prescription of metoprolol which she has taken only once. She has a resting sinus tachycardia around 100-110 bpm. She has had no sustained heart racing or symptomatic SVT. No syncope or chest pain. No peripheral edema. Allergies  Allergen Reactions  . Shellfish Allergy Hives  . Montelukast Sodium Hives  . Other     Allergic to pecans TREE NUTS - raw throat     Current Outpatient Prescriptions  Medication Sig Dispense Refill  . albuterol (PROVENTIL HFA;VENTOLIN HFA) 108 (90 BASE) MCG/ACT inhaler Inhale 2 puffs into the lungs every 6 (six) hours as needed. For breathing    . cetirizine (ZYRTEC) 10 MG tablet Take 10 mg by mouth daily.    Marland Kitchen EPINEPHrine 0.3 mg/0.3 mL IJ SOAJ injection Inject 1 mL into the muscle as directed.    . metoprolol tartrate (LOPRESSOR) 25 MG tablet Take 1 tablet (25 mg total) by mouth 4 (four) times daily as needed. May take up to 4 times per day for palpitations as discussed. 120 tablet 4  . mometasone (NASONEX) 50 MCG/ACT nasal spray Place 1 spray into the nose daily.     . Norgestimate-Ethinyl Estradiol Triphasic (TRI-LINYAH) 0.18/0.215/0.25 MG-35 MCG tablet Take 1 tablet by mouth daily.    . sodium fluoride (SF 5000 PLUS) 1.1 % CREA dental cream Place 1 application onto teeth every morning.     . tacrolimus (PROTOPIC) 0.1 % ointment Apply 1 application topically 2 (two) times daily as needed (rash).      No current facility-administered medications for this  visit.      Past Medical History:  Diagnosis Date  . Asthma   . Eczema   . Wolff-Parkinson-White (WPW) pattern     ROS:   All systems reviewed and negative except as noted in the HPI.   Past Surgical History:  Procedure Laterality Date  . SVT ABLATION N/A 02/11/2017   Procedure: SVT Ablation;  Surgeon: Marinus Maw, MD;  Location: [facility] INVASIVE CV LAB;  Service: Cardiovascular;  Laterality: N/A;  . TONSILLECTOMY AND ADENOIDECTOMY       No family history on file.   Social History   Social History  . Marital status: Single    Spouse name: N/A  . Number of children: N/A  . Years of education: N/A   Occupational History  . Not on file.   Social History Main Topics  . Smoking status: Never Smoker  . Smokeless tobacco: Never Used  . Alcohol use No  . Drug use: No  . Sexual activity: Not on file   Other Topics Concern  . Not on file   Social History Narrative  . No narrative on file     BP 112/70   Pulse (!) 112   Ht 5\' 3"  (1.6 m)   Wt 194 lb (88 kg)   SpO2 99%   BMI 34.37 kg/m   Physical Exam:  Well appearing 21 year old woman, NAD HEENT: Unremarkable Neck:  No JVD, no thyromegally Lymphatics:  No  adenopathy Back:  No CVA tenderness Lungs:  Clear with no wheezes, rales, or rhonchi HEART:  Regular rate rhythm, no murmurs, no rubs, no clicks Abd:  soft, positive bowel sounds, no organomegally, no rebound, no guarding Ext:  2 plus pulses, no edema, no cyanosis, no clubbing Skin:  No rashes no nodules Neuro:  CN II through XII intact, motor grossly intact  EKG normal sinus rhythm with ventricular preexcitation-    Assess/Plan: 1. WPW syndrome - the patient is stable after attempted catheter ablation. She still has persistent accessory pathway conduction although she has had no recurrent SVT. I recommended watchful waiting. We discussed taking her beta blocker more frequently but she has had problems with low blood pressure. I suggested that if she  experiences more palpitations, she can increase the salt in her diet and probably tolerate low-dose beta blockers if needed. 2. Sinus tachycardia - she does have some resting sinus tachycardia and complains of feeling hot. She has turned the air way down in her room. I wonder if she could have some thyroid dysfunction and have asked her to check a TSH and a free T4. 3. Chest pain - she has had no recurrent symptoms in the past several weeks. She will undergo watchful waiting.  Lewayne Bunting, M.D.

## 2017-03-10 NOTE — Patient Instructions (Addendum)
Medication Instructions:  Your physician recommends that you continue on your current medications as directed. Please refer to the Current Medication list given to you today.  Labwork:  TSH and T4 drawn today.   Testing/Procedures: None ordered.  Follow-Up: Your physician wants you to follow-up in: 6 months with Dr. Ladona Ridgel.   You will receive a reminder letter in the mail two months in advance. If you don't receive a letter, please call our office to schedule the follow-up appointment.   Any Other Special Instructions Will Be Listed Below (If Applicable).    If you need a refill on your cardiac medications before your next appointment, please call your pharmacy.

## 2017-03-11 LAB — TSH: TSH: 1.88 u[IU]/mL (ref 0.450–4.500)

## 2017-03-11 LAB — T4, FREE: Free T4: 1.24 ng/dL (ref 0.82–1.77)

## 2017-10-05 IMAGING — CT CT ANGIO CHEST
1 of 8 series · 17 of 36 positions shown · IV contrast (Iohexol (Omnipaque 350))
Comparison: None.

CLINICAL DATA: Chest pain under the left breast and left ribs
radiating to the back since 3 a.m.. Positive D-dimer.

EXAM:
CT ANGIOGRAPHY CHEST WITH CONTRAST
TECHNIQUE: Multidetector CT imaging of the chest was performed using the
standard protocol during bolus administration of intravenous
contrast. Multiplanar CT image reconstructions and MIPs were
obtained to evaluate the vascular anatomy.
CONTRAST:  100 mL Isovue 370

[Series 406: thins pacs · axial · 0.54mm/px · z∈[-423,-196]mm · 17 of 257 slices shown]
[im 15/257  lung]
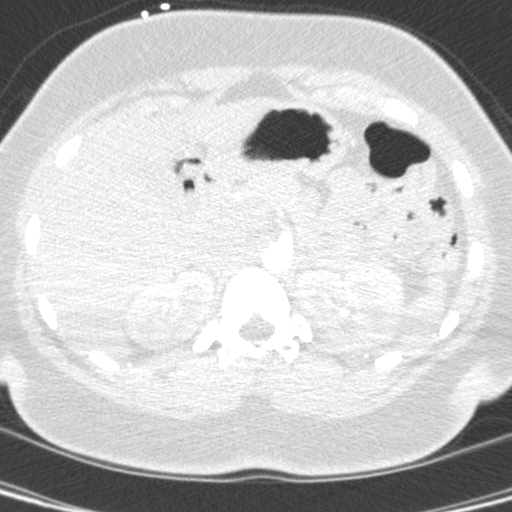
[im 29/257  mediastinal]
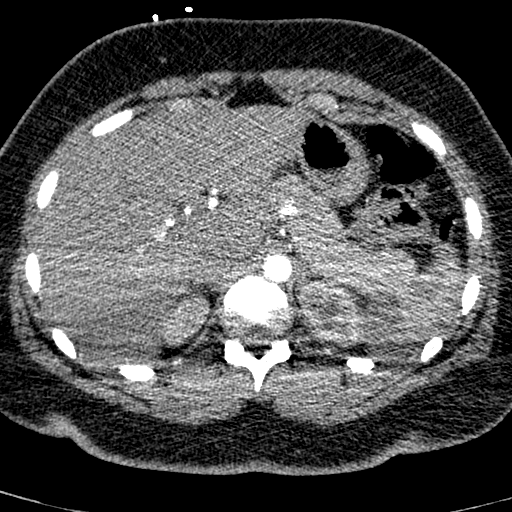
[im 43/257  lung]
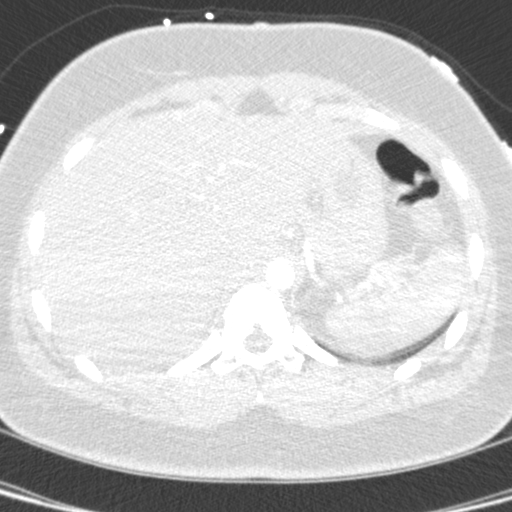
[im 57/257  mediastinal]
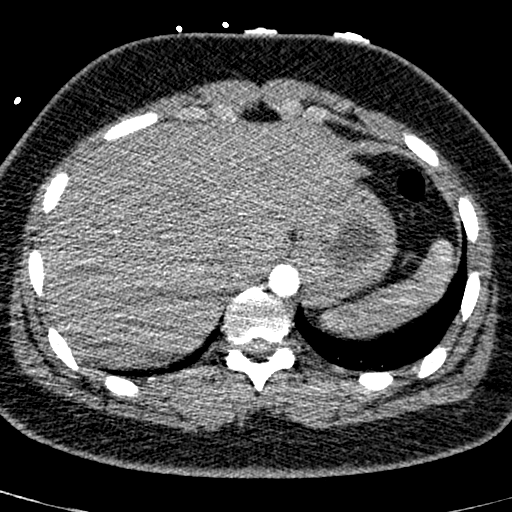
[im 72/257  lung]
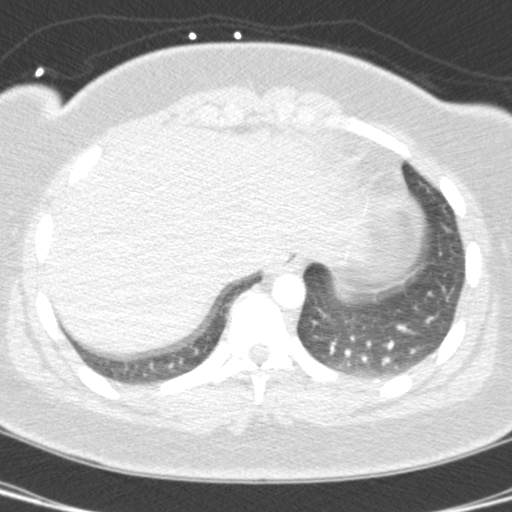
[im 86/257  mediastinal]
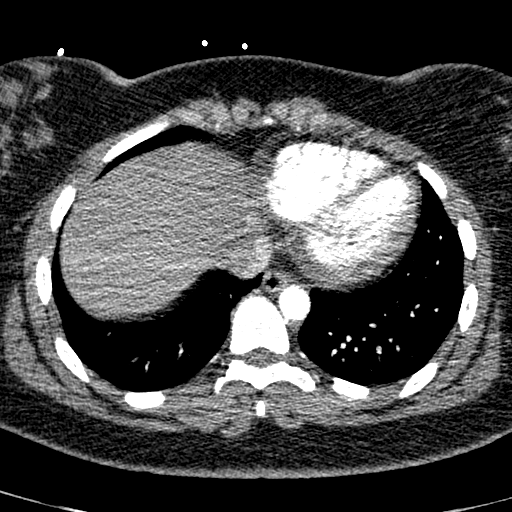
[im 100/257  lung]
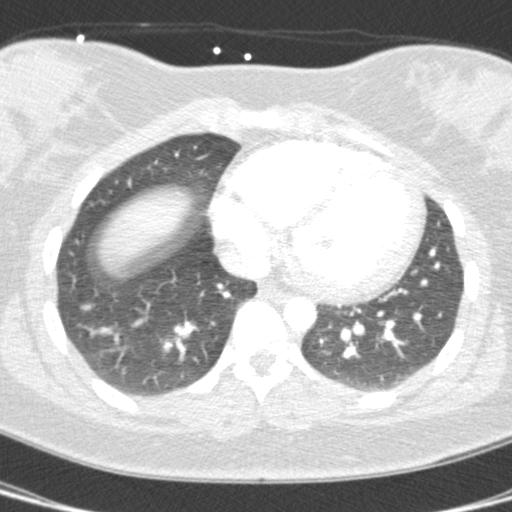
[im 114/257  mediastinal]
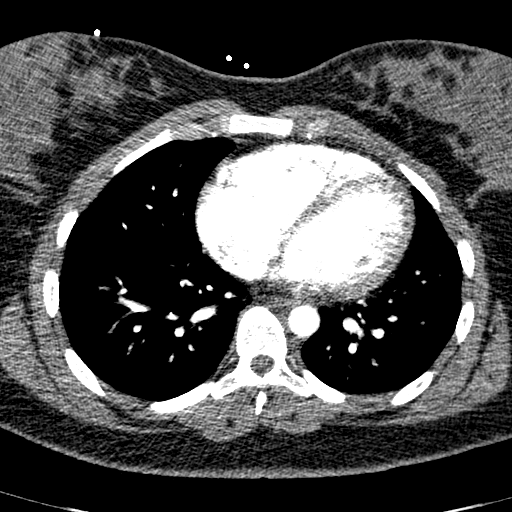
[im 129/257  lung]
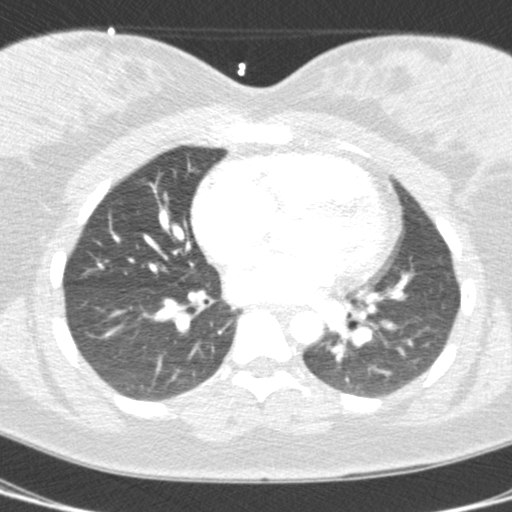
[im 143/257  mediastinal]
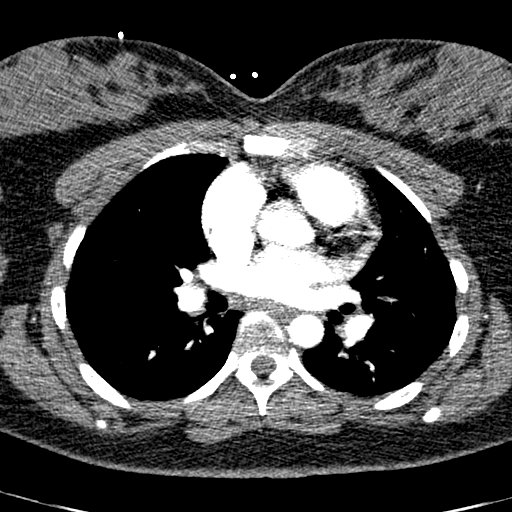
[im 157/257  lung]
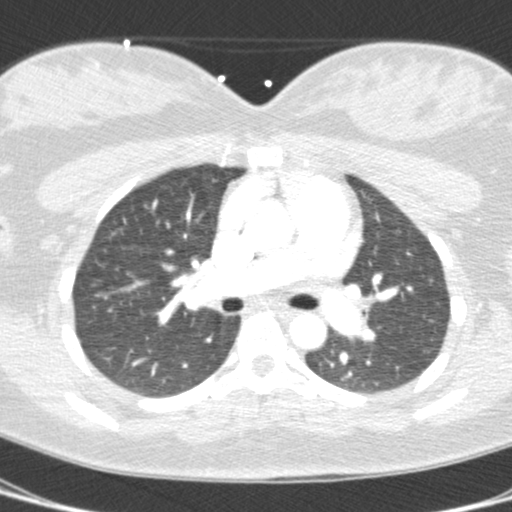
[im 171/257  mediastinal]
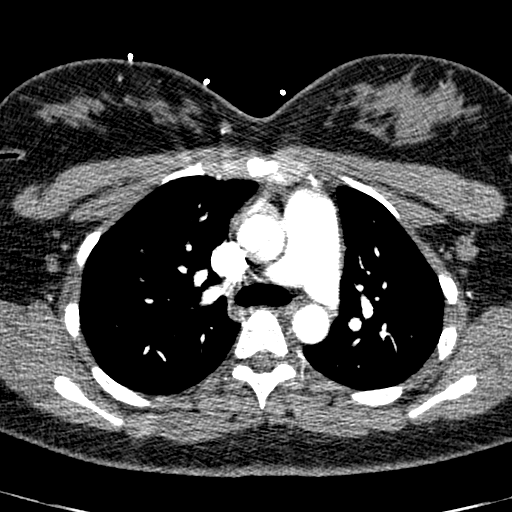
[im 185/257  lung]
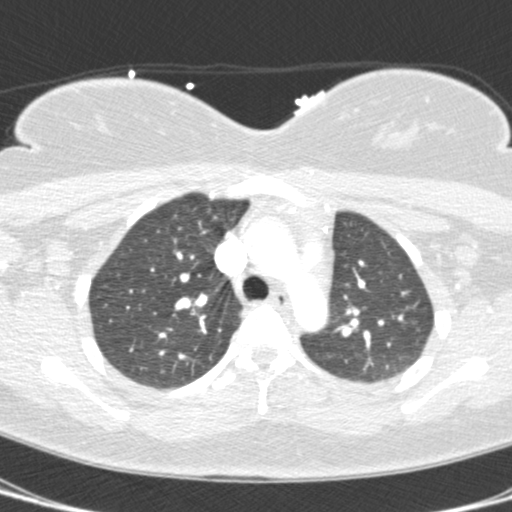
[im 200/257  mediastinal]
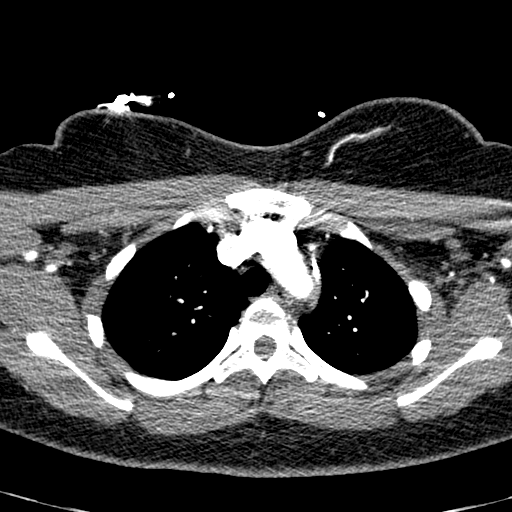
[im 214/257  lung]
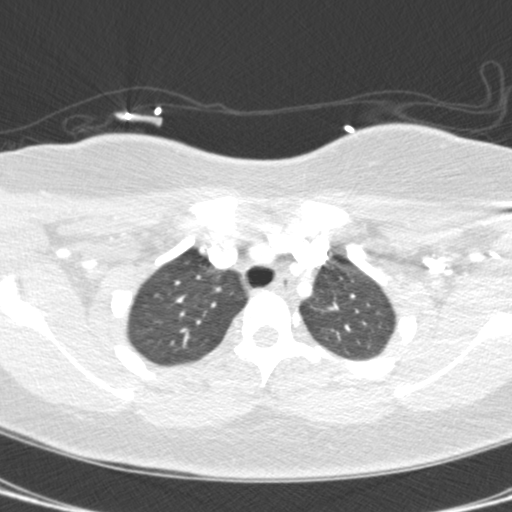
[im 228/257  mediastinal]
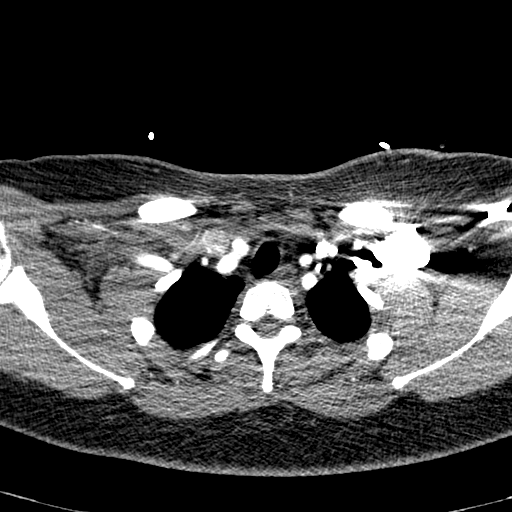
[im 242/257  lung]
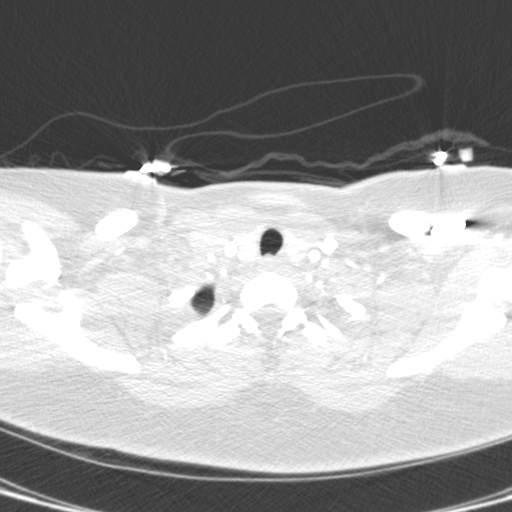

[17 of 36 positions shown; findings below may reference images not displayed]

FINDINGS: Cardiovascular: Satisfactory opacification of the pulmonary arteries
to the segmental level. No evidence of pulmonary embolism. Normal
heart size. No pericardial effusion. Normal caliber thoracic aorta.
No aortic dissection.

Mediastinum/Nodes: No enlarged mediastinal, hilar, or axillary lymph
nodes. Thyroid gland, trachea, and esophagus demonstrate no
significant findings.

Lungs/Pleura: Evaluation is limited due to motion artifact. No focal
airspace disease or consolidation. No pleural effusions. No
pneumothorax. Airways are patent.

Upper Abdomen: No acute abnormality.

Musculoskeletal: Prominent lymph nodes in the axillary regions
bilaterally. Largest right axillary node measures 11 mm short axis
dimension. These are nonspecific but likely to be reactive. No
destructive bone lesions.

Review of the MIP images confirms the above findings.
IMPRESSION: No evidence of significant pulmonary embolus. No evidence of active
pulmonary disease. Prominent axillary lymph nodes are likely
reactive.

## 2017-10-12 ENCOUNTER — Encounter: Payer: Self-pay | Admitting: Internal Medicine

## 2017-10-12 ENCOUNTER — Encounter (INDEPENDENT_AMBULATORY_CARE_PROVIDER_SITE_OTHER): Payer: Self-pay

## 2017-10-27 ENCOUNTER — Encounter: Payer: Self-pay | Admitting: Internal Medicine

## 2017-11-06 ENCOUNTER — Encounter: Payer: Self-pay | Admitting: Internal Medicine

## 2017-11-06 ENCOUNTER — Ambulatory Visit: Payer: 59 | Admitting: Internal Medicine

## 2017-11-06 VITALS — BP 116/72 | HR 90 | Ht 64.0 in | Wt 199.0 lb

## 2017-11-06 DIAGNOSIS — I471 Supraventricular tachycardia: Secondary | ICD-10-CM

## 2017-11-06 NOTE — Progress Notes (Signed)
HPI Nicole Becker returns today for followup. She is a pleasant 22 yo woman with a h/o SVT and WPW who underwent EP study and ablation several months ago.She had a right free wall AP which we could ablate but the conduction would always return. The patient was placed on medical therapy with lopressor. 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 She has had minimal palpitations and has taken her beta blocker as needed and only rarely, less than once a month. Her palpitations always last for less than a minute.  Allergies  Allergen Reactions  . Shellfish Allergy Hives  . Montelukast Sodium Hives  . Other     Pecans and tree nuts - raw throat Tree pollens, grasses, weeds, fungi, dogs, cats, mites, cockroaches - unknown reactions     Current Outpatient Medications  Medication Sig Dispense Refill  . albuterol (PROVENTIL HFA;VENTOLIN HFA) 108 (90 BASE) MCG/ACT inhaler Inhale 2 puffs into the lungs every 6 (six) hours as needed. For breathing    . cetirizine (ZYRTEC) 10 MG tablet Take 10 mg by mouth daily.    Marland Kitchen EPINEPHrine 0.3 mg/0.3 mL IJ SOAJ injection Inject 1 mL into the muscle as directed.    . metoprolol tartrate (LOPRESSOR) 25 MG tablet Take 1 tablet (25 mg total) by mouth 4 (four) times daily as needed. May take up to 4 times per day for palpitations as discussed. 120 tablet 4  . mometasone (NASONEX) 50 MCG/ACT nasal spray Place 1 spray into the nose daily.     . Norgestimate-Ethinyl Estradiol Triphasic (TRI-LINYAH) 0.18/0.215/0.25 MG-35 MCG tablet Take 1 tablet by mouth daily.    . sodium fluoride (SF 5000 PLUS) 1.1 % CREA dental cream Place 1 application onto teeth every morning.     . tacrolimus (PROTOPIC) 0.1 % ointment Apply 1 application topically 2 (two) times daily as needed (rash).      No current facility-administered medications for this visit.      Past Medical History:  Diagnosis Date  . Asthma   . Eczema   . Wolff-Parkinson-White (WPW)  pattern     ROS:   All systems reviewed and negative except as noted in the HPI.   Past Surgical History:  Procedure Laterality Date  . SVT ABLATION N/A 02/11/2017   Procedure: SVT Ablation;  Surgeon: Marinus Maw, MD;  Location: [facility] INVASIVE CV LAB;  Service: Cardiovascular;  Laterality: N/A;  . TONSILLECTOMY AND ADENOIDECTOMY       No family history on file.   Social History   Socioeconomic History  . Marital status: Single    Spouse name: Not on file  . Number of children: Not on file  . Years of education: Not on file  . Highest education level: Not on file  Occupational History  . Not on file  Social Needs  . Financial resource strain: Not on file  . Food insecurity:    Worry: Not on file    Inability: Not on file  . Transportation needs:    Medical: Not on file    Non-medical: Not on file  Tobacco Use  . Smoking status: Never Smoker  . Smokeless tobacco: Never Used  Substance and Sexual Activity  . Alcohol use: No  . Drug use: No  . Sexual activity: Not on file  Lifestyle  . Physical activity:    Days per week: Not on file    Minutes per session: Not on file  .  Stress: Not on file  Relationships  . Social connections:    Talks on phone: Not on file    Gets together: Not on file    Attends religious service: Not on file    Active member of club or organization: Not on file    Attends meetings of clubs or organizations: Not on file    Relationship status: Not on file  . Intimate partner violence:    Fear of current or ex partner: Not on file    Emotionally abused: Not on file    Physically abused: Not on file    Forced sexual activity: Not on file  Other Topics Concern  . Not on file  Social History Narrative  . Not on file     BP 116/72   Pulse 90   Ht 5\' 4"  (1.626 m)   Wt 199 lb (90.3 kg)   SpO2 98%   BMI 34.16 kg/m   Physical Exam:  Well appearing NAD HEENT: Unremarkable Neck:  No JVD, no thyromegally Lymphatics:  No  adenopathy Back:  No CVA tenderness Lungs:  Clear HEART:  Regular rate rhythm, no murmurs, no rubs, no clicks Abd:  soft, positive bowel sounds, no organomegally, no rebound, no guarding Ext:  2 plus pulses, no edema, no cyanosis, no clubbing Skin:  No rashes no nodules Neuro:  CN II through XII intact, motor grossly intact  EKG - NSR with manifest WPW  DEVICE  Normal device function.  See PaceArt for details.   Assess/Plan: 1. WPW - she is minimally symptomatic and her heart has not raced. We discussed the expectations with the patient and her parents. She will continue her current treatment and call us if she has a break through of her atrial fib. 2. Obesity - she is encouraged to start exercising. She is overweight.  Leonia ReevesGregg Arjun Hard,M.D.

## 2017-11-06 NOTE — Patient Instructions (Addendum)
Medication Instructions:  Your physician recommends that you continue on your current medications as directed. Please refer to the Current Medication list given to you today.  Labwork: None ordered.  Testing/Procedures: None ordered.  Follow-Up: Your physician wants you to follow-up in: 8 months with Dr. Taylor.   You will receive a reminder letter in the mail two months in advance. If you don't receive a letter, please call our office to schedule the follow-up appointment.   Any Other Special Instructions Will Be Listed Below (If Applicable).  If you need a refill on your cardiac medications before your next appointment, please call your pharmacy.   

## 2018-06-23 ENCOUNTER — Ambulatory Visit: Payer: 59 | Admitting: Internal Medicine

## 2018-06-23 ENCOUNTER — Encounter: Payer: Self-pay | Admitting: Internal Medicine

## 2018-06-23 VITALS — BP 122/72 | HR 95 | Ht 64.0 in | Wt 197.0 lb

## 2018-06-23 DIAGNOSIS — I456 Pre-excitation syndrome: Secondary | ICD-10-CM

## 2018-06-23 NOTE — Patient Instructions (Signed)

## 2018-06-23 NOTE — Progress Notes (Signed)
HPI Nicole Becker returns today for ongoing evaluation of WPW syndrome. She is a pleasant otherwise healthy 22 yo woman with a h/o palpitations. She underwent EP study and ablation over a year ago. She had persistent pathway conduction. Since her ablation and despite persistent right lateral AP conduction, she has not had any symptomatic SVT. No other complaints today. She is pending college graduation. Allergies  Allergen Reactions  . Shellfish Allergy Hives  . Montelukast Sodium Hives  . Other     Pecans and tree nuts - raw throat Tree pollens, grasses, weeds, fungi, dogs, cats, mites, cockroaches - unknown reactions     Current Outpatient Medications  Medication Sig Dispense Refill  . albuterol (PROVENTIL HFA;VENTOLIN HFA) 108 (90 BASE) MCG/ACT inhaler Inhale 2 puffs into the lungs every 6 (six) hours as needed. For breathing    . cetirizine (ZYRTEC) 10 MG tablet Take 10 mg by mouth daily.    . metoprolol tartrate (LOPRESSOR) 25 MG tablet Take 1 tablet (25 mg total) by mouth 4 (four) times daily as needed. May take up to 4 times per day for palpitations as discussed. 120 tablet 4  . mometasone (NASONEX) 50 MCG/ACT nasal spray Place 1 spray into the nose daily.     . Norgestimate-Ethinyl Estradiol Triphasic (TRI-LINYAH) 0.18/0.215/0.25 MG-35 MCG tablet Take 1 tablet by mouth daily.    . sodium fluoride (SF 5000 PLUS) 1.1 % CREA dental cream Place 1 application onto teeth every morning.     . tacrolimus (PROTOPIC) 0.1 % ointment Apply 1 application topically 2 (two) times daily as needed (rash).     . EPINEPHrine 0.3 mg/0.3 mL IJ SOAJ injection Inject 1 mL into the muscle as directed.     No current facility-administered medications for this visit.      Past Medical History:  Diagnosis Date  . Asthma   . Eczema   . Wolff-Parkinson-White (WPW) pattern     ROS:   All systems reviewed and negative except as noted in the HPI.   Past Surgical History:  Procedure  Laterality Date  . SVT ABLATION N/A 02/11/2017   Procedure: SVT Ablation;  Surgeon: Marinus Mawaylor, Ervey Fallin W, MD;  [facility];  Service: Cardiovascular;  Laterality: N/A;  . TONSILLECTOMY AND ADENOIDECTOMY       No family history on file.   Social History   Socioeconomic History  . Marital status: Single    Spouse name: Not on file  . Number of children: Not on file  . Years of education: Not on file  . Highest education level: Not on file  Occupational History  . Not on file  Social Needs  . Financial resource strain: Not on file  . Food insecurity:    Worry: Not on file    Inability: Not on file  . Transportation needs:    Medical: Not on file    Non-medical: Not on file  Tobacco Use  . Smoking status: Never Smoker  . Smokeless tobacco: Never Used  Substance and Sexual Activity  . Alcohol use: No  . Drug use: No  . Sexual activity: Not on file  Lifestyle  . Physical activity:    Days per week: Not on file    Minutes per session: Not on file  . Stress: Not on file  Relationships  . Social connections:    Talks on phone: Not on file    Gets together: Not on file    Attends religious service:  Not on file    Active member of club or organization: Not on file    Attends meetings of clubs or organizations: Not on file    Relationship status: Not on file  . Intimate partner violence:    Fear of current or ex partner: Not on file    Emotionally abused: Not on file    Physically abused: Not on file    Forced sexual activity: Not on file  Other Topics Concern  . Not on file  Social History Narrative  . Not on file     BP 122/72   Pulse 95   Ht 5\' 4"  (1.626 m)   Wt 197 lb (89.4 kg)   BMI 33.81 kg/m   Physical Exam:  Well appearing NAD HEENT: Unremarkable Neck:  No JVD, no thyromegally Lymphatics:  No adenopathy Back:  No CVA tenderness Lungs:  Clear with no wheezes HEART:  Regular rate rhythm, no murmurs, no rubs, no clicks Abd:  soft,  positive bowel sounds, no organomegally, no rebound, no guarding Ext:  2 plus pulses, no edema, no cyanosis, no clubbing Skin:  No rashes no nodules Neuro:  CN II through XII intact, motor grossly intact  EKG - nsr with pre-excitation.    Assess/Plan: 1. WPW pattern - the patient is asymptomatic. She is on no medical therapy. Despite persistent antergrade pathway conduction, she appears to have had some modification of her AP as she has gone from experiencing SVT and having symptoms to none since her procedure. She will undergo watchful waiting. If she were to experience symptoms then she will call us.

## 2019-06-24 ENCOUNTER — Telehealth (INDEPENDENT_AMBULATORY_CARE_PROVIDER_SITE_OTHER): Payer: 59 | Admitting: Internal Medicine

## 2019-06-24 ENCOUNTER — Telehealth: Payer: 59 | Admitting: Internal Medicine

## 2019-06-24 ENCOUNTER — Encounter: Payer: Self-pay | Admitting: Internal Medicine

## 2019-06-24 ENCOUNTER — Other Ambulatory Visit: Payer: Self-pay

## 2019-06-24 VITALS — BP 129/86 | HR 101 | Ht 64.0 in | Wt 195.0 lb

## 2019-06-24 DIAGNOSIS — I456 Pre-excitation syndrome: Secondary | ICD-10-CM | POA: Diagnosis not present

## 2019-06-24 NOTE — Progress Notes (Signed)
Electrophysiology TeleHealth Note   Due to national recommendations of social distancing due to COVID 19, an audio/video telehealth visit is felt to be most appropriate for this patient at this time.  See MyChart message from today for the patient's consent to telehealth for [facility].   Date:  06/24/2019   ID:  Nicole Becker, DOB [birth year], MRN [id number]  Location: patient's home  Provider location: [address] [place]  Evaluation Performed: Follow-up visit  PCP:  System, Pcp Not In  Cardiologist:  No primary care provider on file.  Electrophysiologist:  Dr Lovena Le  Chief Complaint:  "I've been doing alright."  History of Present Illness:    Nicole Becker is a 23 y.o. female who presents via audio/video conferencing for a telehealth visit today.  Since last being seen in our clinic, the patient reports doing very well. 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 We could not reach her pathway and despite making the pathway go away, it would always return. Since then however, she has not had symptomatic SVT. She has occaisional palps and her HR on the moitor is around 100.  Today, she denies symptoms of palpitations, chest pain, shortness of breath,  lower extremity edema, dizziness, presyncope, or syncope.  The patient is otherwise without complaint today.  The patient denies symptoms of fevers, chills, cough, or new SOB worrisome for COVID 19.  Past Medical History:  Diagnosis Date  . Asthma   . Eczema   . Wolff-Parkinson-White (WPW) pattern     Past Surgical History:  Procedure Laterality Date  . SVT ABLATION N/A 02/11/2017   Procedure: SVT Ablation;  Surgeon: Evans Lance, MD;  Location: [facility];  Service: Cardiovascular;  Laterality: N/A;  . TONSILLECTOMY AND ADENOIDECTOMY      Current Outpatient Medications  Medication Sig Dispense Refill  .  albuterol (PROVENTIL HFA;VENTOLIN HFA) 108 (90 BASE) MCG/ACT inhaler Inhale 2 puffs into the lungs every 6 (six) hours as needed. For breathing    . cetirizine (ZYRTEC) 10 MG tablet Take 10 mg by mouth daily.    . Dupilumab (DUPIXENT) 300 MG/2ML SOPN Inject 1 Dose into the skin every 14 (fourteen) days.    Marland Kitchen EPINEPHrine 0.3 mg/0.3 mL IJ SOAJ injection Inject 1 mL into the muscle as directed.    . metoprolol tartrate (LOPRESSOR) 25 MG tablet Take 1 tablet (25 mg total) by mouth 4 (four) times daily as needed. May take up to 4 times per day for palpitations as discussed. 120 tablet 4  . mometasone (NASONEX) 50 MCG/ACT nasal spray Place 1 spray into the nose daily.     . Norgestimate-Ethinyl Estradiol Triphasic (TRI-LINYAH) 0.18/0.215/0.25 MG-35 MCG tablet Take 1 tablet by mouth daily.    . sodium fluoride (SF 5000 PLUS) 1.1 % CREA dental cream Place 1 application onto teeth every morning.     . tacrolimus (PROTOPIC) 0.1 % ointment Apply 1 application topically 2 (two) times daily as needed (rash).      No current facility-administered medications for this visit.    Allergies:   Shellfish allergy, Montelukast sodium, and Other   Social History:  The patient  reports that she has never smoked. She has never used smokeless tobacco. She reports that she does not drink alcohol or use drugs.   Family History:  The patient's  family history is not on file.   ROS:  Please see the history of present illness.   All other systems are personally reviewed and negative.    Exam:    Vital Signs:  BP 129/86   Pulse (!) 101   Ht 5\' 4"  (1.626 m)   Wt 195 lb (88.5 kg)   BMI 33.47 kg/m    Labs/Other Tests and Data Reviewed:    Recent Labs: No results found for requested labs within last 8760 hours.   Wt Readings from Last 3 Encounters:  06/24/19 195 lb (88.5 kg)  06/23/18 197 lb (89.4 kg)  11/06/17 199 lb (90.3 kg)     Other studies personally reviewed: Additional studies/ records that were  reviewed today include:   ASSESSMENT & PLAN:    1.  WPW - I have discussed the treatment options with the patient and recomnmended watchful waiting. She will not need to undergo repeat ablation. She will continue her medical therapy as needed and avoid another ablation unless her symptoms were to worsen. 2. COVID 19 screen The patient denies symptoms of COVID 19 at this time.  The importance of social distancing was discussed today.  Follow-up:  One year Next remote: n/a  Current medicines are reviewed at length with the patient today.   The patient does not have concerns regarding her medicines.  The following changes were made today:  none  Labs/ tests ordered today include: none No orders of the defined types were placed in this encounter.    Patient Risk:  after full review of this patients clinical status, I feel that they are at moderate risk at this time.  Today, I have spent 15 minutes with the patient with telehealth technology discussing all of the above .    Signed, 11/08/17, MD  06/24/2019 5:32 PM     [facility] [address] [place] [phone] (office) [phone] (fax)
